# Patient Record
Sex: Male | Born: 1965
Health system: Southern US, Community
[De-identification: ages and names within clinical notes are randomized; demographics above are authoritative.]

## PROBLEM LIST (undated history)

## (undated) DIAGNOSIS — M419 Scoliosis, unspecified: Secondary | ICD-10-CM

## (undated) DIAGNOSIS — E119 Type 2 diabetes mellitus without complications: Secondary | ICD-10-CM

## (undated) DIAGNOSIS — J40 Bronchitis, not specified as acute or chronic: Secondary | ICD-10-CM

## (undated) DIAGNOSIS — T7840XA Allergy, unspecified, initial encounter: Secondary | ICD-10-CM

## (undated) DIAGNOSIS — S4990XA Unspecified injury of shoulder and upper arm, unspecified arm, initial encounter: Secondary | ICD-10-CM

## (undated) HISTORY — DX: Allergy, unspecified, initial encounter: T78.40XA

## (undated) HISTORY — DX: Scoliosis, unspecified: M41.9

## (undated) HISTORY — PX: WISDOM TOOTH EXTRACTION: SHX21

---

## 2014-10-20 ENCOUNTER — Emergency Department: Payer: Self-pay | Admitting: Emergency Medicine

## 2015-07-25 ENCOUNTER — Ambulatory Visit (INDEPENDENT_AMBULATORY_CARE_PROVIDER_SITE_OTHER): Payer: BLUE CROSS/BLUE SHIELD | Admitting: Family Medicine

## 2015-07-25 VITALS — BP 108/70 | HR 64 | Temp 98.3°F | Resp 16 | Ht 73.75 in | Wt 217.0 lb

## 2015-07-25 DIAGNOSIS — Z1211 Encounter for screening for malignant neoplasm of colon: Secondary | ICD-10-CM

## 2015-07-25 DIAGNOSIS — R7303 Prediabetes: Secondary | ICD-10-CM | POA: Diagnosis not present

## 2015-07-25 DIAGNOSIS — Z0000 Encounter for general adult medical examination without abnormal findings: Secondary | ICD-10-CM

## 2015-07-25 LAB — POCT CBC
GRANULOCYTE PERCENT: 48.3 % (ref 37–80)
HCT, POC: 43.4 % — AB (ref 43.5–53.7)
HEMOGLOBIN: 13.1 g/dL — AB (ref 14.1–18.1)
Lymph, poc: 1.8 (ref 0.6–3.4)
MCH: 29 pg (ref 27–31.2)
MCHC: 30.3 g/dL — AB (ref 31.8–35.4)
MCV: 95.8 fL (ref 80–97)
MID (CBC): 0.4 (ref 0–0.9)
MPV: 7.3 fL (ref 0–99.8)
POC GRANULOCYTE: 2.1 (ref 2–6.9)
POC LYMPH PERCENT: 42.7 %L (ref 10–50)
POC MID %: 9 % (ref 0–12)
Platelet Count, POC: 204 10*3/uL (ref 142–424)
RBC: 4.53 M/uL — AB (ref 4.69–6.13)
RDW, POC: 13.4 %
WBC: 4.3 10*3/uL — AB (ref 4.6–10.2)

## 2015-07-25 LAB — COMPREHENSIVE METABOLIC PANEL
ALK PHOS: 49 U/L (ref 40–115)
ALT: 69 U/L — AB (ref 9–46)
AST: 55 U/L — AB (ref 10–35)
Albumin: 4.3 g/dL (ref 3.6–5.1)
BILIRUBIN TOTAL: 0.5 mg/dL (ref 0.2–1.2)
BUN: 17 mg/dL (ref 7–25)
CO2: 24 mmol/L (ref 20–31)
CREATININE: 1.01 mg/dL (ref 0.70–1.33)
Calcium: 9.5 mg/dL (ref 8.6–10.3)
Chloride: 103 mmol/L (ref 98–110)
GLUCOSE: 106 mg/dL — AB (ref 65–99)
Potassium: 4.1 mmol/L (ref 3.5–5.3)
SODIUM: 138 mmol/L (ref 135–146)
TOTAL PROTEIN: 7.9 g/dL (ref 6.1–8.1)

## 2015-07-25 LAB — POCT GLYCOSYLATED HEMOGLOBIN (HGB A1C): Hemoglobin A1C: 6.2

## 2015-07-25 LAB — LIPID PANEL
Cholesterol: 222 mg/dL — ABNORMAL HIGH (ref 125–200)
HDL: 50 mg/dL (ref >=40)
LDL CALC: 141 mg/dL — AB (ref <130)
TRIGLYCERIDES: 154 mg/dL — AB (ref <150)
Total CHOL/HDL Ratio: 4.4 Ratio (ref <=5.0)
VLDL: 31 mg/dL — AB (ref <30)

## 2015-07-25 LAB — HEMOGLOBIN A1C: HEMOGLOBIN A1C: 6.2 % — AB (ref 4.0–6.0)

## 2015-07-25 NOTE — Progress Notes (Signed)
Physical examination:  History: 49 year old man who comes in for a exam because he is getting ready to get married on Saturday. He has generally been doing well with no major problems or concerns. He just wanted to make sure everything was fine before he got married.  There is a family history of diabetes and he wants to make sure that is checked out.  Past medical history: Hospitalizations: None Operations: None Major illnesses: None Regular medications: None Medication allergies none Seasonal allergies to pollen  Family history: Father is deceased, had diabetes. Mother still living and has diabetes. The patient has 3 children all living and well.  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 He gets a lot of exercise with his work. He does not smoke, drink, or use drugs. He be having a church wedding this weekend. One of his children is getting ready to go into the [employer], the [employer].  Review of systems: Constitutional: Unremarkable HEENT unremarkable. He does probably need some dental work done some time. 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 Respiratory: Unremarkable Cardiovascular: Unremarkable GI: Unremarkable GU: Unremarkable Musculoskeletal: Unremarkable Dermatologic: Unremarkable Neurologic: Unremarkable Psychiatric: Unremarkable  Physical exam: Healthy-appearing gentleman in no acute distress. TMs normal. Throat clear. 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 Has a couple of little cavity type lesions on at least one of his molars. His neck is supple without nodes or thyromegaly. Chest clear to auscultation. Heart regular without murmurs. No bruits. Abdomen soft without mass or tenderness. Normal male external genitalia with testes descended. No hernias. Extremities unremarkable. Skin unremarkable.  Normal physical examination  Plan: Check some screening labs on him.  Recommend a screening colonoscopy in the not too distant future  Results for orders placed or performed in visit on 07/25/15  POCT CBC  Result Value Ref Range   WBC 4.3 (A) 4.6 - 10.2 K/uL   Lymph, poc 1.8 0.6 - 3.4   POC LYMPH PERCENT 42.7 10 - 50 %L   MID (cbc) 0.4 0 - 0.9   POC MID % 9.0 0 - 12 %M   POC Granulocyte 2.1 2 - 6.9   Granulocyte percent 48.3 37 - 80 %G   RBC 4.53 (A) 4.69 - 6.13 M/uL   Hemoglobin 13.1 (A) 14.1 - 18.1 g/dL   HCT, POC 60.4 (A) 54.0 - 53.7 %   MCV 95.8 80 - 97 fL   MCH, POC 29.0 27 - 31.2 pg   MCHC 30.3 (A) 31.8 - 35.4 g/dL   RDW, POC 98.1 %   Platelet Count, POC 204 142 - 424 K/uL   MPV 7.3 0 - 99.8 fL  POCT glycosylated hemoglobin (Hb A1C)  Result Value Ref Range   Hemoglobin A1C 6.2

## 2015-07-25 NOTE — Patient Instructions (Signed)
Try to get some regular cardio type exercise (aerobic) such as walking or running or swimming or biking or other exercise machinery on a regular basis about 30 minutes a day 5 days a week.  Try to decrease the intake of excessive carbohydrates such as breads, pastas, potatoes, rice. Brown red/green products are better for you.  Set a goal of losing about 15 pounds to get down below 200  Return in 6 months for a recheck, which would be about early April  Return sooner if problems  I recommend you consider getting a flu shot

## 2015-07-26 LAB — GC/CHLAMYDIA PROBE AMP
CT PROBE, AMP APTIMA: NEGATIVE
GC PROBE AMP APTIMA: NEGATIVE

## 2015-07-26 LAB — HIV ANTIBODY (ROUTINE TESTING W REFLEX): HIV 1&2 Ab, 4th Generation: NONREACTIVE

## 2015-07-26 LAB — PSA: PSA: 1.26 ng/mL (ref <=4.00)

## 2015-07-26 LAB — RPR

## 2015-08-22 ENCOUNTER — Encounter: Payer: Self-pay | Admitting: Family Medicine

## 2016-02-06 ENCOUNTER — Ambulatory Visit: Payer: Self-pay | Admitting: Urology

## 2016-05-07 ENCOUNTER — Other Ambulatory Visit: Payer: Self-pay | Admitting: Family Medicine

## 2016-05-07 ENCOUNTER — Ambulatory Visit (INDEPENDENT_AMBULATORY_CARE_PROVIDER_SITE_OTHER): Payer: BLUE CROSS/BLUE SHIELD | Admitting: Family Medicine

## 2016-05-07 VITALS — BP 122/76 | HR 79 | Temp 98.3°F | Resp 17 | Ht 74.0 in | Wt 221.0 lb

## 2016-05-07 DIAGNOSIS — E785 Hyperlipidemia, unspecified: Secondary | ICD-10-CM | POA: Diagnosis not present

## 2016-05-07 DIAGNOSIS — N529 Male erectile dysfunction, unspecified: Secondary | ICD-10-CM

## 2016-05-07 DIAGNOSIS — R748 Abnormal levels of other serum enzymes: Secondary | ICD-10-CM | POA: Diagnosis not present

## 2016-05-07 LAB — COMPREHENSIVE METABOLIC PANEL
ALBUMIN: 4.5 g/dL (ref 3.6–5.1)
ALK PHOS: 51 U/L (ref 40–115)
ALT: 64 U/L — AB (ref 9–46)
AST: 46 U/L — ABNORMAL HIGH (ref 10–35)
BUN: 13 mg/dL (ref 7–25)
CHLORIDE: 103 mmol/L (ref 98–110)
CO2: 27 mmol/L (ref 20–31)
CREATININE: 1.19 mg/dL (ref 0.70–1.33)
Calcium: 9.3 mg/dL (ref 8.6–10.3)
Glucose, Bld: 122 mg/dL — ABNORMAL HIGH (ref 65–99)
POTASSIUM: 4.3 mmol/L (ref 3.5–5.3)
SODIUM: 138 mmol/L (ref 135–146)
TOTAL PROTEIN: 8 g/dL (ref 6.1–8.1)
Total Bilirubin: 0.6 mg/dL (ref 0.2–1.2)

## 2016-05-07 LAB — LIPASE: LIPASE: 17 U/L (ref 7–60)

## 2016-05-07 MED ORDER — SILDENAFIL CITRATE 20 MG PO TABS: ORAL_TABLET | ORAL | Status: DC

## 2016-05-07 NOTE — Patient Instructions (Signed)
Take the sildenafil  20 mg 2-4 pills approximately one to 2 hours prior to anticipated intercourse.  Get regular exercise  Work on weight loss  We will let you know the results of your cholesterol level and your liver enzymes.  Return as needed

## 2016-05-07 NOTE — Progress Notes (Addendum)
Patient ID: Carl Barnett, male    DOB: December 12, 1965  Age: 50 y.o. MRN: [id number]  Chief Complaint  Patient presents with  . Medication Problem    Subjective:   50 year old man previously seen by me who is here with a problem with erectile dysfunction that is been going on for 2 months. He took 3 "testosterone pills" then I friend of his gave him. They made him very high and jittery and nervous. I suspect they were some other substance than testosterone. At any rate he has not taken anymore contaminated not help the erectile dysfunction.  He never did come back in for labs as instructed last year. He is not certain he ever got the message. He is a Naval architecttruck driver.  Current allergies, medications, problem list, past/family and social histories reviewed.  Objective:  BP 122/76 mmHg  Pulse 79  Temp(Src) 98.3 F (36.8 C) (Oral)  Resp 17  Ht 6\' 2"  (1.88 m)  Wt 221 lb (100.245 kg)  BMI 28.36 kg/m2  SpO2 99%  No acute distress. Long discussion. Did not reexamine him.  Assessment & Plan:   Assessment: 1. Erectile dysfunction, unspecified erectile dysfunction type   2. Hyperlipidemia   3. Elevated liver enzymes       Plan: See instructions. Will refer to a urologist if he keeps having ADD problems.  Orders Placed This Encounter  Procedures  . Comprehensive metabolic panel  . Lipase    Meds ordered this encounter  Medications  . sildenafil (REVATIO) 20 MG tablet    Sig: Take 2-4 pills one hour prior to anticipated intercourse    Dispense:  30 tablet    Refill:  2         Patient Instructions  Take the sildenafil  20 mg 2-4 pills approximately one to 2 hours prior to anticipated intercourse.  Get regular exercise  Work on weight loss  We will let you know the results of your cholesterol level and your liver enzymes.  Return as needed     Return if symptoms worsen or fail to improve. I accidentally ordered lipase instead of lipids. That will be ordered. The  liver enzymes are still a little elevated so will check hep B and hep C.  Maley Venezia, MD 05/07/2016

## 2016-05-07 NOTE — Addendum Note (Signed)
Addended by: Inaki Vantine H on: 05/07/2016 06:19 PM   Modules accepted: Orders

## 2016-05-08 LAB — LIPID PANEL
CHOLESTEROL: 201 mg/dL — AB (ref 125–200)
HDL: 55 mg/dL (ref >=40)
LDL Cholesterol: 122 mg/dL (ref <130)
Total CHOL/HDL Ratio: 3.7 Ratio (ref <=5.0)
Triglycerides: 122 mg/dL (ref <150)
VLDL: 24 mg/dL (ref <30)

## 2016-05-08 LAB — HEPATITIS C ANTIBODY: HCV AB: NEGATIVE

## 2016-05-08 LAB — HEPATITIS B SURFACE ANTIBODY, QUANTITATIVE: HEPATITIS B-POST: 0 m[IU]/mL

## 2018-01-01 DIAGNOSIS — M47812 Spondylosis without myelopathy or radiculopathy, cervical region: Secondary | ICD-10-CM | POA: Insufficient documentation

## 2018-03-12 DIAGNOSIS — G5622 Lesion of ulnar nerve, left upper limb: Secondary | ICD-10-CM | POA: Insufficient documentation

## 2019-04-23 ENCOUNTER — Ambulatory Visit: Payer: Self-pay | Admitting: *Deleted

## 2019-04-23 ENCOUNTER — Other Ambulatory Visit: Payer: Self-pay

## 2019-04-23 DIAGNOSIS — Z20822 Contact with and (suspected) exposure to covid-19: Secondary | ICD-10-CM

## 2019-04-23 NOTE — Telephone Encounter (Signed)
  Patient was tested in April for [place]- diagnosed with bronchitis. Patient was around someone at work who was very ill. Patient does not have PCP in area. Patient has been set up for testing and he is going to follow up at [facility]/virtual visit for cough and other symptoms- in case bronchitis. Patient plans to establish care with local PCP as soon as he gets insurance.  Reason for Disposition . [1] Continuous (nonstop) coughing interferes with work or school AND [2] no improvement using cough treatment per protocol  Answer Assessment - Initial Assessment Questions 1. COVID-19 DIAGNOSIS: "Who made your Coronavirus (COVID-19) diagnosis?" "Was it confirmed by a positive lab test?" If not diagnosed by a HCP, ask "Are there lots of cases (community spread) where you live?" (See public health department website, if unsure)     Patient is having symptoms 2. ONSET: "When did the COVID-19 symptoms start?"      Tuesday 3. WORST SYMPTOM: "What is your worst symptom?" (e.g., cough, fever, shortness of breath, muscle aches)     Cough  4. COUGH: "Do you have a cough?" If so, ask: "How bad is the cough?"       Yes- heavy phlegm  5. FEVER: "Do you have a fever?" If so, ask: "What is your temperature, how was it measured, and when did it start?"     No- yesterday was sweating 6. RESPIRATORY STATUS: "Describe your breathing?" (e.g., shortness of breath, wheezing, unable to speak)      Patient has inhaler- has been using- is some better 7. BETTER-SAME-WORSE: "Are you getting better, staying the same or getting worse compared to yesterday?"  If getting worse, ask, "In what way?"     Same 8. HIGH RISK DISEASE: "Do you have any chronic medical problems?" (e.g., asthma, heart or lung disease, weak immune system, etc.)     no 9. PREGNANCY: "Is there any chance you are pregnant?" "When was your last menstrual period?"     n/a 10. OTHER SYMPTOMS: "Do you have any other symptoms?"  (e.g., chills, fatigue,  headache, loss of smell or taste, muscle pain, sore throat)       Fatigue, headache  Protocols used: CORONAVIRUS (COVID-19) DIAGNOSED OR SUSPECTED-A-AH

## 2019-04-29 ENCOUNTER — Telehealth: Payer: Self-pay | Admitting: *Deleted

## 2019-04-29 LAB — NOVEL CORONAVIRUS, NAA: SARS-CoV-2, NAA: DETECTED — AB

## 2019-04-29 NOTE — Telephone Encounter (Signed)
Left message on VM for pt to CB regarding covid results.  CB# [phone]

## 2019-05-03 ENCOUNTER — Emergency Department (HOSPITAL_COMMUNITY): Payer: HRSA Program

## 2019-05-03 ENCOUNTER — Other Ambulatory Visit: Payer: Self-pay

## 2019-05-03 ENCOUNTER — Observation Stay (HOSPITAL_COMMUNITY)
Admission: EM | Admit: 2019-05-03 | Discharge: 2019-05-04 | Disposition: A | Discharge status: Home / Self Care | Payer: HRSA Program | Attending: Student | Admitting: Student

## 2019-05-03 ENCOUNTER — Encounter (HOSPITAL_COMMUNITY): Payer: Self-pay

## 2019-05-03 DIAGNOSIS — R042 Hemoptysis: Secondary | ICD-10-CM | POA: Diagnosis present

## 2019-05-03 DIAGNOSIS — Z8249 Family history of ischemic heart disease and other diseases of the circulatory system: Secondary | ICD-10-CM | POA: Diagnosis not present

## 2019-05-03 DIAGNOSIS — U071 COVID-19: Principal | ICD-10-CM | POA: Diagnosis present

## 2019-05-03 DIAGNOSIS — R030 Elevated blood-pressure reading, without diagnosis of hypertension: Secondary | ICD-10-CM | POA: Diagnosis not present

## 2019-05-03 DIAGNOSIS — R05 Cough: Secondary | ICD-10-CM | POA: Diagnosis present

## 2019-05-03 DIAGNOSIS — R059 Cough, unspecified: Secondary | ICD-10-CM

## 2019-05-03 DIAGNOSIS — Z79899 Other long term (current) drug therapy: Secondary | ICD-10-CM | POA: Insufficient documentation

## 2019-05-03 LAB — CBC WITH DIFFERENTIAL/PLATELET
Abs Immature Granulocytes: 0.01 10*3/uL (ref 0.00–0.07)
Basophils Absolute: 0 10*3/uL (ref 0.0–0.1)
Basophils Relative: 0 %
Eosinophils Absolute: 0.1 10*3/uL (ref 0.0–0.5)
Eosinophils Relative: 2 %
HCT: 39 % (ref 39.0–52.0)
Hemoglobin: 12.4 g/dL — ABNORMAL LOW (ref 13.0–17.0)
Immature Granulocytes: 0 %
Lymphocytes Relative: 49 %
Lymphs Abs: 1.9 10*3/uL (ref 0.7–4.0)
MCH: 31.9 pg (ref 26.0–34.0)
MCHC: 31.8 g/dL (ref 30.0–36.0)
MCV: 100.3 fL — ABNORMAL HIGH (ref 80.0–100.0)
Monocytes Absolute: 0.3 10*3/uL (ref 0.1–1.0)
Monocytes Relative: 8 %
Neutro Abs: 1.6 10*3/uL — ABNORMAL LOW (ref 1.7–7.7)
Neutrophils Relative %: 41 %
Platelets: 152 10*3/uL (ref 150–400)
RBC: 3.89 MIL/uL — ABNORMAL LOW (ref 4.22–5.81)
RDW: 11.8 % (ref 11.5–15.5)
WBC: 3.8 10*3/uL — ABNORMAL LOW (ref 4.0–10.5)
nRBC: 0 % (ref 0.0–0.2)

## 2019-05-03 LAB — COMPREHENSIVE METABOLIC PANEL
ALT: 92 U/L — ABNORMAL HIGH (ref 0–44)
AST: 59 U/L — ABNORMAL HIGH (ref 15–41)
Albumin: 3.4 g/dL — ABNORMAL LOW (ref 3.5–5.0)
Alkaline Phosphatase: 59 U/L (ref 38–126)
Anion gap: 10 (ref 5–15)
BUN: 10 mg/dL (ref 6–20)
CO2: 24 mmol/L (ref 22–32)
Calcium: 8.9 mg/dL (ref 8.9–10.3)
Chloride: 104 mmol/L (ref 98–111)
Creatinine, Ser: 0.87 mg/dL (ref 0.61–1.24)
GFR calc Af Amer: >60 mL/min (ref >60)
GFR calc non Af Amer: >60 mL/min (ref >60)
Glucose, Bld: 91 mg/dL (ref 70–99)
Potassium: 3.6 mmol/L (ref 3.5–5.1)
Sodium: 138 mmol/L (ref 135–145)
Total Bilirubin: 0.8 mg/dL (ref 0.3–1.2)
Total Protein: 7.1 g/dL (ref 6.5–8.1)

## 2019-05-03 LAB — FERRITIN: Ferritin: 795 ng/mL — ABNORMAL HIGH (ref 24–336)

## 2019-05-03 LAB — ABO/RH: ABO/RH(D): A POS

## 2019-05-03 LAB — TRIGLYCERIDES: Triglycerides: 131 mg/dL (ref <150)

## 2019-05-03 LAB — LACTIC ACID, PLASMA: Lactic Acid, Venous: 0.9 mmol/L (ref 0.5–1.9)

## 2019-05-03 LAB — LACTATE DEHYDROGENASE: LDH: 218 U/L — ABNORMAL HIGH (ref 98–192)

## 2019-05-03 LAB — C-REACTIVE PROTEIN: CRP: 3.6 mg/dL — ABNORMAL HIGH (ref <1.0)

## 2019-05-03 LAB — PROCALCITONIN: Procalcitonin: 0.12 ng/mL

## 2019-05-03 LAB — SARS CORONAVIRUS 2 BY RT PCR (HOSPITAL ORDER, PERFORMED IN ~~LOC~~ HOSPITAL LAB): SARS Coronavirus 2: POSITIVE — AB

## 2019-05-03 MED ORDER — HYDRALAZINE HCL 20 MG/ML IJ SOLN: 5.0000 mg | Freq: Four times a day (QID) | INTRAMUSCULAR | Status: DC | PRN

## 2019-05-03 MED ORDER — ONDANSETRON HCL 4 MG PO TABS: 4.0000 mg | ORAL_TABLET | Freq: Four times a day (QID) | ORAL | Status: DC | PRN

## 2019-05-03 MED ORDER — ZINC SULFATE 220 (50 ZN) MG PO CAPS
220.0000 mg | ORAL_CAPSULE | Freq: Every day | ORAL | Status: DC
  Administered 2019-05-03 – 2019-05-04 (×2): 220 mg via ORAL
  Filled 2019-05-03 (×2): qty 1

## 2019-05-03 MED ORDER — ALBUTEROL SULFATE HFA 108 (90 BASE) MCG/ACT IN AERS
2.0000 | INHALATION_SPRAY | Freq: Four times a day (QID) | RESPIRATORY_TRACT | Status: DC
  Administered 2019-05-03 – 2019-05-04 (×2): 2 via RESPIRATORY_TRACT
  Filled 2019-05-03 (×2): qty 6.7

## 2019-05-03 MED ORDER — ONDANSETRON HCL 4 MG/2ML IJ SOLN: 4.0000 mg | Freq: Four times a day (QID) | INTRAMUSCULAR | Status: DC | PRN

## 2019-05-03 MED ORDER — GUAIFENESIN-DM 100-10 MG/5ML PO SYRP
10.0000 mL | ORAL_SOLUTION | ORAL | Status: DC | PRN
  Administered 2019-05-04 (×2): 10 mL via ORAL
  Filled 2019-05-03 (×2): qty 10

## 2019-05-03 MED ORDER — ENOXAPARIN SODIUM 40 MG/0.4ML ~~LOC~~ SOLN
40.0000 mg | SUBCUTANEOUS | Status: DC
  Administered 2019-05-03: 40 mg via SUBCUTANEOUS
  Filled 2019-05-03: qty 0.4

## 2019-05-03 MED ORDER — VITAMIN C 500 MG PO TABS
500.0000 mg | ORAL_TABLET | Freq: Every day | ORAL | Status: DC
  Administered 2019-05-03 – 2019-05-04 (×2): 500 mg via ORAL
  Filled 2019-05-03 (×2): qty 1

## 2019-05-03 MED ORDER — METHYLPREDNISOLONE SODIUM SUCC 40 MG IJ SOLR
40.0000 mg | Freq: Once | INTRAMUSCULAR | Status: AC
  Administered 2019-05-03: 40 mg via INTRAVENOUS
  Filled 2019-05-03: qty 1

## 2019-05-03 MED ORDER — HYDROCOD POLST-CPM POLST ER 10-8 MG/5ML PO SUER: 5.0000 mL | Freq: Two times a day (BID) | ORAL | Status: DC | PRN

## 2019-05-03 MED ORDER — ACETAMINOPHEN 325 MG PO TABS
650.0000 mg | ORAL_TABLET | Freq: Four times a day (QID) | ORAL | Status: DC | PRN
  Administered 2019-05-04: 650 mg via ORAL
  Filled 2019-05-03: qty 2

## 2019-05-03 NOTE — ED Provider Notes (Addendum)
[facility] Provider Note   CSN: [id number] Arrival date & time: 05/03/19  1511     History   Chief Complaint No chief complaint on file.   HPI Carl Barnett is a 53 y.o. male.     53 year old male with medical history as detailed below presents for evaluation of cough and shortness of breath.  Patient reports increasing cough over the last 1 to 2 weeks.  Patient reports that he was tested for COVID last Tuesday -however, he is unaware of the results.  He reports subjective fevers at home.  He reports a productive cough with some blood tinged sputum.  He denies associated chest pain.  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  He has no routine primary medical care.  The history is provided by the patient and medical records.  Cough Cough characteristics:  Productive Sputum characteristics:  Bloody Severity:  Mild Onset quality:  Gradual Duration:  2 weeks Timing:  Constant Progression:  Worsening Chronicity:  New Relieved by:  Nothing Worsened by:  Nothing Associated symptoms: fever     Past Medical History:  Diagnosis Date   Allergy     There are no active problems to display for this patient.   No past surgical history on file.      Home Medications    Prior to Admission medications   Medication Sig Start Date End Date Taking? Authorizing Provider  Acetaminophen-guaiFENesin [facility](THERAFLU FLU/CHEST CONGESTION PO) Take 1 packet by mouth every 6 (six) hours as needed (for chest congestion/malaise).   Yes [provider]  Albuterol Sulfate 108 (90 Base) MCG/ACT AEPB Inhale 2 puffs into the lungs every 4 (four) hours as needed (for shortness of breath or wheezing).   Yes [provider]  guaiFENesin (MUCINEX) 600 MG 12 hr tablet Take 600 mg by mouth 2 (two) times daily as needed for to loosen phlegm.   Yes [provider]  sildenafil (REVATIO) 20 MG tablet Take 2-4 pills  one hour prior to anticipated intercourse Patient not taking: Reported on 05/03/2019 05/07/16   Peyton NajjarHopper, David H, MD    Family History Family History  Problem Relation Age of Onset   Diabetes Mother    Stroke Mother    Diabetes Father    Hyperlipidemia Father    Hypertension Father     Social History Social History   Tobacco Use   Smoking status: Never Smoker  Substance Use Topics   Alcohol use: No    Alcohol/week: 0.0 standard drinks   Drug use: No     Allergies   Patient has no known allergies.   Review of Systems Review of Systems  Constitutional: Positive for fever.  Respiratory: Positive for cough.   All other systems reviewed and are negative.    Physical Exam Updated Vital Signs BP (!) 170/98 (BP Location: Left Arm)    Pulse (!) 58    Temp 99.1 F (37.3 C) (Oral)    Resp 15    SpO2 99%   Physical Exam Vitals signs and nursing note reviewed.  Constitutional:      General: He is not in acute distress.    Appearance: Normal appearance. He is well-developed.  HENT:     Head: Normocephalic and atraumatic.  Eyes:     Conjunctiva/sclera: Conjunctivae normal.     Pupils: Pupils are equal, round, and reactive to light.  Neck:     Musculoskeletal: Normal range of motion and  neck supple.  Cardiovascular:     Rate and Rhythm: Normal rate and regular rhythm.     Heart sounds: Normal heart sounds.  Pulmonary:     Effort: Pulmonary effort is normal. No respiratory distress.     Breath sounds: Normal breath sounds.  Abdominal:     General: There is no distension.     Palpations: Abdomen is soft.     Tenderness: There is no abdominal tenderness.  Musculoskeletal: Normal range of motion.        General: No deformity.  Skin:    General: Skin is warm and dry.  Neurological:     Mental Status: He is alert and oriented to person, place, and time.      ED Treatments / Results  Labs (all labs ordered are listed, but only abnormal results are  displayed) Labs Reviewed  SARS CORONAVIRUS 2 ([facility]) - Abnormal; Notable for the following components:      Result Value   SARS Coronavirus 2 POSITIVE (*)    All other components within normal limits  CBC WITH DIFFERENTIAL/PLATELET - Abnormal; Notable for the following components:   WBC 3.8 (*)    RBC 3.89 (*)    Hemoglobin 12.4 (*)    MCV 100.3 (*)    Neutro Abs 1.6 (*)    All other components within normal limits  COMPREHENSIVE METABOLIC PANEL - Abnormal; Notable for the following components:   Albumin 3.4 (*)    AST 59 (*)    ALT 92 (*)    All other components within normal limits  LACTATE DEHYDROGENASE - Abnormal; Notable for the following components:   LDH 218 (*)    All other components within normal limits  FERRITIN - Abnormal; Notable for the following components:   Ferritin 795 (*)    All other components within normal limits  C-REACTIVE PROTEIN - Abnormal; Notable for the following components:   CRP 3.6 (*)    All other components within normal limits  CULTURE, BLOOD (ROUTINE X 2)  CULTURE, BLOOD (ROUTINE X 2)  LACTIC ACID, PLASMA  PROCALCITONIN  TRIGLYCERIDES  LACTIC ACID, PLASMA    EKG EKG Interpretation  Date/Time:  Sunday May 03 2019 15:41:38 EDT Ventricular Rate:  56 PR Interval:    QRS Duration: 82 QT Interval:  448 QTC Calculation: 433 R Axis:   39 Text Interpretation:  Sinus rhythm Atrial premature complex Confirmed by Dene Gentry [phone]) on 05/03/2019 4:08:15 PM   Radiology Dg Chest Port 1 View  Result Date: 05/03/2019 CLINICAL DATA:  Positive COVID-19 test with fatigue and productive cough, initial encounter EXAM: PORTABLE CHEST 1 VIEW COMPARISON:  10/20/2014 FINDINGS: Cardiac shadow is stable. The lungs are well aerated bilaterally. No focal infiltrate or sizable effusion is seen. No bony abnormality is noted. IMPRESSION: No acute abnormality noted. Electronically Signed   By: Inez Catalina M.D.    On: 05/03/2019 16:54    Procedures Procedures (including critical care time)  Medications Ordered in ED Medications - No data to display   Initial Impression / Assessment and Plan / ED Course  I have reviewed the triage vital signs and the nursing notes.  Pertinent labs & imaging results that were available during my care of the patient were reviewed by me and considered in my medical decision making (see chart for details).        MDM  Screen complete  Kanoa Phillippi was evaluated in Emergency Department on 05/03/2019 for the  symptoms described in the history of present illness. He was evaluated in the context of the global COVID-19 pandemic, which necessitated consideration that the patient might be at risk for infection with the SARS-CoV-2 virus that causes COVID-19. Institutional protocols and algorithms that pertain to the evaluation of patients at risk for COVID-19 are in a state of rapid change based on information released by regulatory bodies including the CDC and federal and state organizations. These policies and algorithms were followed during the patient's care in the ED.   Patient is presenting with reported cough, hemoptysis, and fevers.  Patient is COVID positive.  Patient would likely benefit from further inpatient work-up and treatment.  Medicine service is aware of case and will evaluate for admission.  Final Clinical Impressions(s) / ED Diagnoses   Final diagnoses:  COVID-19 virus detected  Cough  Hemoptysis    ED Discharge Orders    None       Wynetta FinesMessick, Jarquis Walker C, MD 05/03/19 Izola [id number]    Wynetta FinesMessick, Mattie Nordell C, MD 05/03/19 [phone]

## 2019-05-03 NOTE — H&P (Signed)
History and Physical    Carl Barnett FAO:[id number] DOB: [birth year] DOA: 05/03/2019  PCP: Patient, No Pcp Per  Patient coming from: Home  I have personally briefly reviewed patient's old medical records in [place]  Chief Complaint: Cough productive of blood-tinged sputum  HPI: Carl Barnett is a 53 y.o. male with medical history significant for cervical radiculopathy who presents to the ED for evaluation of progressive cough productive of blood-tinged sputum.  Patient states he had bronchitis in early April and May at which time he was tested for COVID-19 twice, both times tested negative.  About 2 weeks ago he had return of similar symptoms with cough productive of blood-tinged sputum.  He had associated musculoskeletal sternal chest pain with coughing.  He has had myalgias the last 2 days.  He otherwise has not had any subjective fevers, diaphoresis, dyspnea, abdominal pain, or diarrhea.  He was tested again for COVID-19 on 04/23/2019 which did result positive on 04/29/2019, however he was unable to be contacted.  He says since his symptoms have been persistent with new myalgias states last 2 days he presented to the ED for further evaluation.  He has been trying supportive care with Mucinex, TheraFlu, Flonase, and an inhaler without significant relief.  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  ED Course:  Initial vitals showed BP 170/98, pulse 58, RR 15, temp 99.1 Fahrenheit, SPO2 99% on room air.  Labs are notable for WBC 3.8, hemoglobin 12.4, platelets 152,000, BUN 10, creatinine 0.87, AST 59, ALT 92, alk phos 59, total bilirubin 0.8.  SARS-CoV-2 test is positive.  Procalcitonin 0.12, LDH 218, ferritin 795, CRP 3.6, triglycerides 131.  Lactic acid 0.9.  Blood cultures were drawn and pending.  Review of Systems: All systems reviewed and are negative except as documented in history of  present illness above.   Past Medical History:  Diagnosis Date  . Allergy     No past surgical history on file.  Social History:  reports that he has never smoked. He does not have any smokeless tobacco history on file. He reports that he does not drink alcohol or use drugs.  No Known Allergies  Family History  Problem Relation Age of Onset  . Diabetes Mother   . Stroke Mother   . Diabetes Father   . Hyperlipidemia Father   . Hypertension Father      Prior to Admission medications   Medication Sig Start Date End Date Taking? Authorizing Provider  Acetaminophen-guaiFENesin [facility] FLU/CHEST CONGESTION PO) Take 1 packet by mouth every 6 (six) hours as needed (for chest congestion/malaise).   Yes [provider]  Albuterol Sulfate 108 (90 Base) MCG/ACT AEPB Inhale 2 puffs into the lungs every 4 (four) hours as needed (for shortness of breath or wheezing).   Yes [provider]  guaiFENesin (MUCINEX) 600 MG 12 hr tablet Take 600 mg by mouth 2 (two) times daily as needed for to loosen phlegm.   Yes [provider]  sildenafil (REVATIO) 20 MG tablet Take 2-4 pills one hour prior to anticipated intercourse Patient not taking: Reported on 05/03/2019 05/07/16   Posey Boyer, MD    Physical Exam: Vitals:   05/03/19 1615 05/03/19 1630 05/03/19 1645 05/03/19 1715  BP: (!) 153/96 (!) 168/100 (!) 162/98 (!) 170/105  Pulse:  (!) 57 (!) 55 (!) 59  Resp: 17 (!) 22 (!) 25 17  Temp:      TempSrc:      SpO2:  96% 97% 98%    Constitutional: Sitting up in bed, NAD, calm, comfortable Eyes: PERRL, lids and conjunctivae normal ENMT: Mucous membranes are moist. Posterior pharynx clear of any exudate or lesions.Normal dentition.  Neck: normal, supple, no masses. Respiratory: Bibasilar inspiratory crackles. Normal respiratory effort. No accessory muscle use.  Cardiovascular: Regular rate and rhythm, no murmurs / rubs / gallops. No extremity edema. 2+ pedal pulses.  Abdomen: no tenderness, no masses palpated. No hepatosplenomegaly. Bowel sounds positive.  Musculoskeletal: no clubbing / cyanosis. No joint deformity upper and lower extremities. Good ROM, no contractures. Normal muscle tone.  Skin: no rashes, lesions, ulcers. No induration Neurologic: CN 2-12 grossly intact. Sensation intact, Strength 5/5 in all 4.  Psychiatric: Normal judgment and insight. Alert and oriented x 3. Normal mood.     Labs on Admission: I have personally reviewed following labs and imaging studies  CBC: Recent Labs  Lab 05/03/19 1620  WBC 3.8*  NEUTROABS 1.6*  HGB 12.4*  HCT 39.0  MCV 100.3*  PLT 160   Basic Metabolic Panel: Recent Labs  Lab 05/03/19 1620  NA 138  K 3.6  CL 104  CO2 24  GLUCOSE 91  BUN 10  CREATININE 0.87  CALCIUM 8.9   GFR: CrCl cannot be calculated (Unknown ideal weight.). Liver Function Tests: Recent Labs  Lab 05/03/19 1620  AST 59*  ALT 92*  ALKPHOS 59  BILITOT 0.8  PROT 7.1  ALBUMIN 3.4*   No results for input(s): LIPASE, AMYLASE in the last 168 hours. No results for input(s): AMMONIA in the last 168 hours. Coagulation Profile: No results for input(s): INR, PROTIME in the last 168 hours. Cardiac Enzymes: No results for input(s): CKTOTAL, CKMB, CKMBINDEX, TROPONINI in the last 168 hours. BNP (last 3 results) No results for input(s): PROBNP in the last 8760 hours. HbA1C: No results for input(s): HGBA1C in the last 72 hours. CBG: No results for input(s): GLUCAP in the last 168 hours. Lipid Profile: Recent Labs    05/03/19 1620  TRIG 131   Thyroid Function Tests: No results for input(s): TSH, T4TOTAL, FREET4, T3FREE, THYROIDAB in the last 72 hours. Anemia Panel: Recent Labs    05/03/19 1620  FERRITIN 795*   Urine analysis: No results found for: COLORURINE, APPEARANCEUR, LABSPEC, PHURINE, GLUCOSEU, HGBUR, BILIRUBINUR, KETONESUR, PROTEINUR, UROBILINOGEN, NITRITE, LEUKOCYTESUR  Radiological Exams on Admission:  Dg Chest Port 1 View  Result Date: 05/03/2019 CLINICAL DATA:  Positive COVID-19 test with fatigue and productive cough, initial encounter EXAM: PORTABLE CHEST 1 VIEW COMPARISON:  10/20/2014 FINDINGS: Cardiac shadow is stable. The lungs are well aerated bilaterally. No focal infiltrate or sizable effusion is seen. No bony abnormality is noted. IMPRESSION: No acute abnormality noted. Electronically Signed   By: Inez Catalina M.D.   On: 05/03/2019 16:54    EKG: Independently reviewed.  Sinus rhythm without acute ischemic changes.  No prior for comparison.  Assessment/Plan Active Problems:   COVID-19 virus infection  Suliman Termini is a 53 y.o. male with medical history significant for cervical radiculopathy who is admitted with upper respiratory infection due to COVID-19.   COVID-19 viral infection: With associated URI/bronchitis.  Currently saturating well on room air.  Chest x-ray is reassuring.  He does have inspiratory crackles on admission examination.  Inflammatory markers show CRP 3.6, ferritin 795, LDH 218.  Procalcitonin 0.12.  Will observe overnight and get 1 dose of IV Solu-Medrol, can likely transition to  oral prednisone and discharge tomorrow. -IV Solu-Medrol 40 mg once now -Supplemental oxygen as needed -Continue albuterol inhaler, senna spirometer, flutter valve, antitussives, vitamin C, and zinc  Elevated blood pressure: Hypertensive on admission, no prior history of hypertension.  Will use IV hydralazine as needed, consider addition of antihypertensive medication on discharge if remains persistently elevated.  DVT prophylaxis: Lovenox Code Status: Full code, confirmed with patient Family Communication: Discussed with patient, he will communicate to family Disposition Plan: Likely discharge to home in 1 day Consults called: None Admission status: Observation   Zada Finders MD [facility]  If 7PM-7AM, please contact night-coverage [email or web address]  05/03/2019, 8:27  PM

## 2019-05-03 NOTE — ED Notes (Signed)
ED TO INPATIENT HANDOFF REPORT  ED Nurse Name and Phone #: Susa RaringLisa, RN [phone]  S Name/Age/Gender Carl Barnett 53 y.o. male Room/Bed: 040C/040C  Code Status   Code Status: Not on file  Home/SNF/Other Home Patient oriented to: self, place, time and situation Is this baseline? Yes   Triage Complete: Triage complete  Chief Complaint cough; Hematemesis  Triage Note No notes on file   Allergies No Known Allergies  Level of Care/Admitting Diagnosis ED Disposition    ED Disposition Condition Comment   Admit  [facility] [[id number]]  Level of Care: Med-Surg [16]  I expect the patient will be discharged within 24 hours: No (not a candidate for 5C-Observation unit)  Covid Evaluation: Confirmed COVID Positive  Diagnosis: COVID-19 virus infection [[id number]][[phone]]  Admitting Physician: Charlsie QuestPATEL, VISHAL R [[id number]]  Attending Physician: Charlsie QuestPATEL, VISHAL R [[id number]]  PT Class (Do Not Modify): Observation [104]  PT Acc Code (Do Not Modify): Observation [10022]       B Medical/Surgery History Past Medical History:  Diagnosis Date  . Allergy    No past surgical history on file.   A IV Location/Drains/Wounds Patient Lines/Drains/Airways Status   Active Line/Drains/Airways    Name:   Placement date:   Placement time:   Site:   Days:   Peripheral IV 05/03/19 Left Antecubital   05/03/19    1618    Antecubital   less than 1          Intake/Output Last 24 hours No intake or output data in the 24 hours ending 05/03/19 2037  Labs/Imaging Results for orders placed or performed during the hospital encounter of 05/03/19 (from the past 48 hour(s))  SARS Coronavirus 2 [facility](Hospital order, Performed in [facility] hospital lab)     Status: Abnormal   Collection Time: 05/03/19  4:20 PM   Specimen: Nasopharyngeal Swab  Result Value Ref Range   SARS Coronavirus 2 POSITIVE (A) NEGATIVE    Comment: CRITICAL RESULT CALLED TO, READ BACK BY AND VERIFIED WITH: RN  MICHELLE C. 1821 [id number]  FCP (NOTE) If result is NEGATIVE SARS-CoV-2 target nucleic acids are NOT DETECTED. The SARS-CoV-2 RNA is generally detectable in upper and lower  respiratory specimens during the acute phase of infection. The lowest  concentration of SARS-CoV-2 viral copies this assay can detect is 250  copies / mL. A negative result does not preclude SARS-CoV-2 infection  and should not be used as the sole basis for treatment or other  patient management decisions.  A negative result may occur with  improper specimen collection / handling, submission of specimen other  than nasopharyngeal swab, presence of viral mutation(s) within the  areas targeted by this assay, and inadequate number of viral copies  (<250 copies / mL). A negative result must be combined with clinical  observations, patient history, and epidemiological information. If result is POSITIVE SARS-CoV-2 target nucleic acids are DETECTE D. The SARS-CoV-2 RNA is generally detectable in upper and lower  respiratory specimens during the acute phase of infection.  Positive  results are indicative of active infection with SARS-CoV-2.  Clinical  correlation with patient history and other diagnostic information is  necessary to determine patient infection status.  Positive results do  not rule out bacterial infection or co-infection with other viruses. If result is PRESUMPTIVE POSTIVE SARS-CoV-2 nucleic acids MAY BE PRESENT.   A presumptive positive result was obtained on the submitted specimen  and confirmed on repeat testing.  While 2019 novel coronavirus  (  SARS-CoV-2) nucleic acids may be present in the submitted sample  additional confirmatory testing may be necessary for epidemiological  and / or clinical management purposes  to differentiate between  SARS-CoV-2 and other Sarbecovirus currently known to infect humans.  If clinically indicated additional testing with an alternate test  methodology (LAB745 3) is  advised. The SARS-CoV-2 RNA is generally  detectable in upper and lower respiratory specimens during the acute  phase of infection. The expected result is Negative. Fact Sheet for Patients:  BoilerBrush.com.cy Fact Sheet for Healthcare Providers: https://pope.com/ This test is not yet approved or cleared by the [place] FDA and has been authorized for detection and/or diagnosis of SARS-CoV-2 by FDA under an Emergency Use Authorization (EUA).  This EUA will remain in effect (meaning this test can be used) for the duration of the COVID-19 declaration under Section 564(b)(1) of the Act, 21 U.S.C. section 360bbb-3(b)(1), unless the authorization is terminated or revoked sooner. Performed at [facility], [address], [place], [place] [address]   CBC WITH DIFFERENTIAL     Status: Abnormal   Collection Time: 05/03/19  4:20 PM  Result Value Ref Range   WBC 3.8 (L) 4.0 - 10.5 K/uL   RBC 3.89 (L) 4.22 - 5.81 MIL/uL   Hemoglobin 12.4 (L) 13.0 - 17.0 g/dL   HCT 91.4 78.2 - 95.6 %   MCV 100.3 (H) 80.0 - 100.0 fL   MCH 31.9 26.0 - 34.0 pg   MCHC 31.8 30.0 - 36.0 g/dL   RDW 21.3 08.6 - 57.8 %   Platelets 152 150 - 400 K/uL   nRBC 0.0 0.0 - 0.2 %   Neutrophils Relative % 41 %   Neutro Abs 1.6 (L) 1.7 - 7.7 K/uL   Lymphocytes Relative 49 %   Lymphs Abs 1.9 0.7 - 4.0 K/uL   Monocytes Relative 8 %   Monocytes Absolute 0.3 0.1 - 1.0 K/uL   Eosinophils Relative 2 %   Eosinophils Absolute 0.1 0.0 - 0.5 K/uL   Basophils Relative 0 %   Basophils Absolute 0.0 0.0 - 0.1 K/uL   Immature Granulocytes 0 %   Abs Immature Granulocytes 0.01 0.00 - 0.07 K/uL    Comment: Performed at [facility], [address] [place], [place] [address]  Comprehensive metabolic panel     Status: Abnormal   Collection Time: 05/03/19  4:20 PM  Result Value Ref Range   Sodium 138 135 - 145 mmol/L   Potassium 3.6 3.5 - 5.1 mmol/L   Chloride 104 98 - 111  mmol/L   CO2 24 22 - 32 mmol/L   Glucose, Bld 91 70 - 99 mg/dL   BUN 10 6 - 20 mg/dL   Creatinine, Ser 9.52 0.61 - 1.24 mg/dL   Calcium 8.9 8.9 - 84.1 mg/dL   Total Protein 7.1 6.5 - 8.1 g/dL   Albumin 3.4 (L) 3.5 - 5.0 g/dL   AST 59 (H) 15 - 41 U/L   ALT 92 (H) 0 - 44 U/L   Alkaline Phosphatase 59 38 - 126 U/L   Total Bilirubin 0.8 0.3 - 1.2 mg/dL   GFR calc non Af Amer >60 >60 mL/min   GFR calc Af Amer >60 >60 mL/min   Anion gap 10 5 - 15    Comment: Performed at [facility], [address], [place], [place] [address]  Procalcitonin     Status: None   Collection Time: 05/03/19  4:20 PM  Result  Value Ref Range   Procalcitonin 0.12 ng/mL    Comment:        Interpretation: PCT (Procalcitonin) <= 0.5 ng/mL: Systemic infection (sepsis) is not likely. Local bacterial infection is possible. (NOTE)       Sepsis PCT Algorithm           Lower Respiratory Tract                                      Infection PCT Algorithm    ----------------------------     ----------------------------         PCT < 0.25 ng/mL                PCT < 0.10 ng/mL         Strongly encourage             Strongly discourage   discontinuation of antibiotics    initiation of antibiotics    ----------------------------     -----------------------------       PCT 0.25 - 0.50 ng/mL            PCT 0.10 - 0.25 ng/mL               OR       >80% decrease in PCT            Discourage initiation of                                            antibiotics      Encourage discontinuation           of antibiotics    ----------------------------     -----------------------------         PCT >= 0.50 ng/mL              PCT 0.26 - 0.50 ng/mL               AND        <80% decrease in PCT             Encourage initiation of                                             antibiotics       Encourage continuation           of antibiotics    ----------------------------     -----------------------------        PCT >= 0.50  ng/mL                  PCT > 0.50 ng/mL               AND         increase in PCT                  Strongly encourage                                      initiation of antibiotics    Strongly encourage escalation  of antibiotics                                     -----------------------------                                           PCT <= 0.25 ng/mL                                                 OR                                        > 80% decrease in PCT                                     Discontinue / Do not initiate                                             antibiotics Performed at [facility], [address] [place], [place] [address]   Lactate dehydrogenase     Status: Abnormal   Collection Time: 05/03/19  4:20 PM  Result Value Ref Range   LDH 218 (H) 98 - 192 U/L    Comment: Performed at [facility], [address] [place], [place] [address]  Ferritin     Status: Abnormal   Collection Time: 05/03/19  4:20 PM  Result Value Ref Range   Ferritin 795 (H) 24 - 336 ng/mL    Comment: Performed at [facility], [address] [place], [place] [address]  C-reactive protein     Status: Abnormal   Collection Time: 05/03/19  4:20 PM  Result Value Ref Range   CRP 3.6 (H) <1.0 mg/dL    Comment: Performed at [facility], [address] [place], [place] [address]  Triglycerides     Status: None   Collection Time: 05/03/19  4:20 PM  Result Value Ref Range   Triglycerides 131 <150 mg/dL    Comment: Performed at [facility], [address] [place], [place] [address]  Lactic acid, plasma     Status: None   Collection Time: 05/03/19  5:22 PM  Result Value Ref Range   Lactic Acid, Venous 0.9 0.5 - 1.9 mmol/L    Comment: Performed at [facility], [address] [place], [place] [address]   Dg Chest Port 1 View  Result Date: 05/03/2019 CLINICAL DATA:  Positive COVID-19 test with fatigue and productive cough, initial encounter  EXAM: PORTABLE CHEST 1 VIEW COMPARISON:  10/20/2014 FINDINGS: Cardiac shadow is stable. The lungs are well aerated bilaterally. No focal infiltrate or sizable effusion is seen. No bony abnormality is noted. IMPRESSION: No acute abnormality noted. Electronically Signed   By: Alcide CleverMark  Lukens M.D.   On: 05/03/2019 16:54    Pending Labs Unresulted Labs (From admission, onward)  Start     Ordered   05/03/19 1522  Blood Culture (routine x 2)  BLOOD CULTURE X 2,   STAT     05/03/19 1523          Vitals/Pain Today's Vitals   05/03/19 1615 05/03/19 1630 05/03/19 1645 05/03/19 1715  BP: (!) 153/96 (!) 168/100 (!) 162/98 (!) 170/105  Pulse:  (!) 57 (!) 55 (!) 59  Resp: 17 (!) 22 (!) 25 17  Temp:      TempSrc:      SpO2:  96% 97% 98%    Isolation Precautions Airborne and Contact precautions  Medications Medications - No data to display  Mobility walks Low fall risk   Focused Assessments Pulmonary Assessment Handoff:  Lung sounds:   O2 Device: Room Air        R Recommendations: See Admitting Provider Note  Report given to:   Additional Notes:

## 2019-05-04 LAB — CBC WITH DIFFERENTIAL/PLATELET
Abs Immature Granulocytes: 0.01 10*3/uL (ref 0.00–0.07)
Basophils Absolute: 0 10*3/uL (ref 0.0–0.1)
Basophils Relative: 0 %
Eosinophils Absolute: 0 10*3/uL (ref 0.0–0.5)
Eosinophils Relative: 0 %
HCT: 42 % (ref 39.0–52.0)
Hemoglobin: 13.5 g/dL (ref 13.0–17.0)
Immature Granulocytes: 0 %
Lymphocytes Relative: 20 %
Lymphs Abs: 0.8 10*3/uL (ref 0.7–4.0)
MCH: 31.5 pg (ref 26.0–34.0)
MCHC: 32.1 g/dL (ref 30.0–36.0)
MCV: 97.9 fL (ref 80.0–100.0)
Monocytes Absolute: 0.1 10*3/uL (ref 0.1–1.0)
Monocytes Relative: 2 %
Neutro Abs: 3.1 10*3/uL (ref 1.7–7.7)
Neutrophils Relative %: 78 %
Platelets: 178 10*3/uL (ref 150–400)
RBC: 4.29 MIL/uL (ref 4.22–5.81)
RDW: 11.7 % (ref 11.5–15.5)
WBC: 4 10*3/uL (ref 4.0–10.5)
nRBC: 0 % (ref 0.0–0.2)

## 2019-05-04 LAB — COMPREHENSIVE METABOLIC PANEL
ALT: 88 U/L — ABNORMAL HIGH (ref 0–44)
AST: 53 U/L — ABNORMAL HIGH (ref 15–41)
Albumin: 3.8 g/dL (ref 3.5–5.0)
Alkaline Phosphatase: 58 U/L (ref 38–126)
Anion gap: 11 (ref 5–15)
BUN: 13 mg/dL (ref 6–20)
CO2: 21 mmol/L — ABNORMAL LOW (ref 22–32)
Calcium: 9.1 mg/dL (ref 8.9–10.3)
Chloride: 103 mmol/L (ref 98–111)
Creatinine, Ser: 1.11 mg/dL (ref 0.61–1.24)
GFR calc Af Amer: >60 mL/min (ref >60)
GFR calc non Af Amer: >60 mL/min (ref >60)
Glucose, Bld: 201 mg/dL — ABNORMAL HIGH (ref 70–99)
Potassium: 4.2 mmol/L (ref 3.5–5.1)
Sodium: 135 mmol/L (ref 135–145)
Total Bilirubin: 0.7 mg/dL (ref 0.3–1.2)
Total Protein: 7.7 g/dL (ref 6.5–8.1)

## 2019-05-04 LAB — C-REACTIVE PROTEIN: CRP: 3.1 mg/dL — ABNORMAL HIGH (ref <1.0)

## 2019-05-04 LAB — CK: Total CK: 1486 U/L — ABNORMAL HIGH (ref 49–397)

## 2019-05-04 LAB — MAGNESIUM: Magnesium: 2.1 mg/dL (ref 1.7–2.4)

## 2019-05-04 LAB — D-DIMER, QUANTITATIVE: D-Dimer, Quant: 0.55 ug/mL-FEU — ABNORMAL HIGH (ref 0.00–0.50)

## 2019-05-04 LAB — PHOSPHORUS: Phosphorus: 3.5 mg/dL (ref 2.5–4.6)

## 2019-05-04 LAB — HIV ANTIBODY (ROUTINE TESTING W REFLEX): HIV Screen 4th Generation wRfx: NONREACTIVE

## 2019-05-04 LAB — FERRITIN: Ferritin: 725 ng/mL — ABNORMAL HIGH (ref 24–336)

## 2019-05-04 MED ORDER — ASCORBIC ACID 500 MG PO TABS: 500.0000 mg | ORAL_TABLET | Freq: Every day | ORAL | 0 refills | Status: AC

## 2019-05-04 MED ORDER — PREDNISONE 50 MG PO TABS: 50.0000 mg | ORAL_TABLET | Freq: Every day | ORAL | 0 refills | Status: DC

## 2019-05-04 MED ORDER — DM-GUAIFENESIN ER 30-600 MG PO TB12: 1.0000 | ORAL_TABLET | Freq: Two times a day (BID) | ORAL | 0 refills | Status: DC

## 2019-05-04 MED ORDER — ZINC SULFATE 220 (50 ZN) MG PO CAPS: 220.0000 mg | ORAL_CAPSULE | Freq: Every day | ORAL | 0 refills | Status: DC

## 2019-05-04 MED FILL — MUCUS RELIEF DM 30-600 MG T: 30-600 | 10 days supply | Qty: 20 | Fill #0

## 2019-05-04 MED FILL — predniSONE 50 MG TABS: 50 | 6 days supply | Qty: 6 | Fill #0

## 2019-05-04 MED FILL — ZINC SULFATE 220 MG CAPSULE: 220 (50 ZN) | 20 days supply | Qty: 20 | Fill #0

## 2019-05-04 MED FILL — VITAMIN C 500 MG TABLET: 500 | 10 days supply | Qty: 20 | Fill #0

## 2019-05-04 NOTE — TOC Transition Note (Signed)
Transition of Care [facility]) - CM/SW Discharge Note   Patient Details  Name: Carl Barnett MRN: [id number] Date of Birth: 1965-12-04  [facility] CM/SW Contact:  Zenon Mayo, RN Phone Number: 05/04/2019, 1:16 PM   Clinical Narrative:    From  Home, for dc today, has no insurance and no PCP, NCM scheduled a telehealth follow up apt at Patient care center for 7/29 at 1 pm, phone number verified.  TOC pharmacy is filling his medications , cost is 20.00, patient states he has this on his card that he will use to pay for meds.  He has no other needs.   Final next level of care: Home/Self Care Barriers to Discharge: No Barriers Identified   Patient Goals and CMS Choice Patient states their goals for this hospitalization and ongoing recovery are:: get better   Choice offered to / list presented to : NA  Discharge Placement                       Discharge Plan and Services                DME Arranged: (NA)         HH Arranged: NA          Social Determinants of Health (SDOH) Interventions     Readmission Risk Interventions No flowsheet data found.

## 2019-05-04 NOTE — Discharge Instructions (Signed)
Prevent the Spread of COVID-19 if You Are Sick If you are sick with COVID-19 or think you might have COVID-19, follow the steps below to help protect other people in your home and community. Stay home except to get medical care.  Stay home. Most people with COVID-19 have mild illness and are able to recover at home without medical care. Do not leave your home, except to get medical care. Do not visit public areas.  Take care of yourself. Get rest and stay hydrated.  Get medical care when needed. Call your doctor before you go to their office for care. But, if you have trouble breathing or other concerning symptoms, call 911 for immediate help.  Avoid public transportation, ride-sharing, or taxis. Separate yourself from other people and pets in your home.  As much as possible, stay in a specific room and away from other people and pets in your home. Also, you should use a separate bathroom, if available. If you need to be around other people or animals in or outside of the home, wear a cloth face covering. ? See COVID-19 and Animals if you have questions about pets: https://www.cdc.gov/coronavirus/2019-ncov/faq.html#COVID19animals Monitor your symptoms.  Common symptoms of COVID-19 include fever and cough. Trouble breathing is a more serious symptom that means you should get medical attention.  Follow care instructions from your healthcare provider and local health department. Your local health authorities will give instructions on checking your symptoms and reporting information. If you develop emergency warning signs for COVID-19 get medical attention immediately.  Emergency warning signs include*:  Trouble breathing  Persistent pain or pressure in the chest  New confusion or not able to be woken  Bluish lips or face *This list is not all inclusive. Please consult your medical provider for any other symptoms that are severe or concerning to you. Call 911 if you have a medical  emergency. If you have a medical emergency and need to call 911, notify the operator that you have or think you might have, COVID-19. If possible, put on a facemask before medical help arrives. Call ahead before visiting your doctor.  Call ahead. Many medical visits for routine care are being postponed or done by phone or telemedicine.  If you have a medical appointment that cannot be postponed, call your doctor's office. This will help the office protect themselves and other patients. If you are sick, wear a cloth covering over your nose and mouth.  You should wear a cloth face covering over your nose and mouth if you must be around other people or animals, including pets (even at home).  You don't need to wear the cloth face covering if you are alone. If you can't put on a cloth face covering (because of trouble breathing for example), cover your coughs and sneezes in some other way. Try to stay at least 6 feet away from other people. This will help protect the people around you. Note: During the COVID-19 pandemic, medical grade facemasks are reserved for healthcare workers and some first responders. You may need to make a cloth face covering using a scarf or bandana. Cover your coughs and sneezes.  Cover your mouth and nose with a tissue when you cough or sneeze.  Throw used tissues in a lined trash can.  Immediately wash your hands with soap and water for at least 20 seconds. If soap and water are not available, clean your hands with an alcohol-based hand sanitizer that contains at least 60% alcohol. Clean your hands often.    Wash your hands often with soap and water for at least 20 seconds. This is especially important after blowing your nose, coughing, or sneezing; going to the bathroom; and before eating or preparing food.  Use hand sanitizer if soap and water are not available. Use an alcohol-based hand sanitizer with at least 60% alcohol, covering all surfaces of your hands and rubbing  them together until they feel dry.  Soap and water are the best option, especially if your hands are visibly dirty.  Avoid touching your eyes, nose, and mouth with unwashed hands. Avoid sharing personal household items.  Do not share dishes, drinking glasses, cups, eating utensils, towels, or bedding with other people in your home.  Wash these items thoroughly after using them with soap and water or put them in the dishwasher. Clean all "high-touch" surfaces everyday.  Clean and disinfect high-touch surfaces in your "sick room" and bathroom. Let someone else clean and disinfect surfaces in common areas, but not your bedroom and bathroom.  If a caregiver or other person needs to clean and disinfect a sick person's bedroom or bathroom, they should do so on an as-needed basis. The caregiver/other person should wear a mask and wait as long as possible after the sick person has used the bathroom. High-touch surfaces include phones, remote controls, counters, tabletops, doorknobs, bathroom fixtures, toilets, keyboards, tablets, and bedside tables.  Clean and disinfect areas that may have blood, stool, or body fluids on them.  Use household cleaners and disinfectants. Clean the area or item with soap and water or another detergent if it is dirty. Then use a household disinfectant. ? Be sure to follow the instructions on the label to ensure safe and effective use of the product. Many products recommend keeping the surface wet for several minutes to ensure germs are killed. Many also recommend precautions such as wearing gloves and making sure you have good ventilation during use of the product. ? Most EPA-registered household disinfectants should be effective. How to discontinue home isolation  People with COVID-19 who have stayed home (home isolated) can stop home isolation under the following conditions: ? If you will not have a test to determine if you are still contagious, you can leave home  after these three things have happened:  You have had no fever for at least 72 hours (that is three full days of no fever without the use of medicine that reduces fevers) AND  other symptoms have improved (for example, when your cough or shortness of breath has improved) AND  at least 10 days have passed since your symptoms first appeared. ? If you will be tested to determine if you are still contagious, you can leave home after these three things have happened:  You no longer have a fever (without the use of medicine that reduces fevers) AND  other symptoms have improved (for example, when your cough or shortness of breath has improved) AND  you received two negative tests in a row, 24 hours apart. Your doctor will follow CDC guidelines. In all cases, follow the guidance of your healthcare provider and local health department. The decision to stop home isolation should be made in consultation with your healthcare provider and state and local health departments. Local decisions depend on local circumstances. cdc.gov/coronavirus 02/22/2019 This information is not intended to replace advice given to you by your health care provider. Make sure you discuss any questions you have with your health care provider. Document Released: 02/03/2019 Document Revised: 03/04/2019 Document Reviewed: 02/03/2019   Elsevier Patient Education  2020 [employer]  

## 2019-05-04 NOTE — Discharge Summary (Signed)
Physician Discharge Summary  Carl Barnett RUE:[id number] DOB: [birth year] DOA: 05/03/2019  PCP: Patient, No Pcp Per  Admit date: 05/03/2019 Discharge date: 05/04/2019  Admitted From: Home Disposition: Home  Recommendations for Outpatient Follow-up:  1. Follow up with PCP in 1-2 weeks 2. Please obtain CBC/CMP at follow up 3. Please follow up on the following pending results: None  Home Health: None Equipment/Devices: None required  Discharge Condition: Stable CODE STATUS: Full code  Hospital Course: 53 year old male with history of cervical radiculopathy presenting with productive cough and associated chest pain for 2 weeks, and myalgia for 2 days.  Tested positive for COVID 19 on 04/23/2019 that resulted on 04/29/19.  However, he was unable to be contacted.  He presented to ED for new onset myalgia for 2 days.  Here he tested positive for COVID-19 in ED.  Hemodynamically stable.  Mild temp to 99.1.  Maintain good saturation on room air.  He had mild LFT and inflammatory markers including elevated CK, ferritin, CRP.  He has no lymphopenia.  Age-adjusted d-dimer within normal range.  Procalcitonin 0.12.  No acute finding on CXR.  He was given IV Solu-Medrol on admission.  Transition to p.o. prednisone and discharged on p.o. prednisone.  Patient was counseled on self-monitoring and current time at home.  He was provided with resources and self-monitoring tools prior to discharge.   Patient was ambulated on room air and maintained oxygen saturation in upper 90s without dyspnea.  See individual problem list below for more.  Discharge Diagnoses:  COVID-19 viral infection: No respiratory distress, oxygen requirement/desaturation or new CXR finding.  He is probably towards the tail end of the infection. -Discharged on prednisone for 5 more days. -Mucolytic's, vitamin C and zinc -Counseled on home self-monitoring, precautions -Patient has my chart updated.  Elevated blood pressure: Improved  prior to discharge. -Outpatient follow-up.  Discharge Instructions  Discharge Instructions    Call MD for:  difficulty breathing, headache or visual disturbances   Complete by: As directed    Call MD for:  extreme fatigue   Complete by: As directed    Call MD for:  persistant dizziness or light-headedness   Complete by: As directed    Call MD for:  temperature >100.4   Complete by: As directed    Diet general   Complete by: As directed    Discharge instructions   Complete by: As directed    Increase activity slowly   Complete by: As directed    [place] COVID-19 home monitoring program   Complete by: May 04, 2019    Is the patient willing to use the [facility] for home monitoring?: Yes   Temperature monitoring   Complete by: May 04, 2019    After how many days would you like to receive a notification of this patient's flowsheet entries?: 1     Allergies as of 05/04/2019   No Known Allergies     Medication List    STOP taking these medications   guaiFENesin 600 MG 12 hr tablet Commonly known as: MUCINEX   sildenafil 20 MG tablet Commonly known as: REVATIO   THERAFLU FLU/CHEST CONGESTION PO     TAKE these medications   Albuterol Sulfate 108 (90 Base) MCG/ACT Aepb Inhale 2 puffs into the lungs every 4 (four) hours as needed (for shortness of breath or wheezing).   ascorbic acid 500 MG tablet Commonly known as: VITAMIN C Take 1 tablet (500 mg total) by mouth daily. Start taking on: May 05, 2019   dextromethorphan-guaiFENesin 30-600 MG 12hr tablet Commonly known as: MUCINEX DM Take 1 tablet by mouth 2 (two) times daily.   predniSONE 50 MG tablet Commonly known as: DELTASONE Take 1 tablet (50 mg total) by mouth daily with breakfast. Start taking on: May 05, 2019   zinc sulfate 220 (50 Zn) MG capsule Take 1 capsule (220 mg total) by mouth daily. Start taking on: May 05, 2019        Consultations:  None  Procedures/Studies:  2D Echo: None   Dg Chest Port 1 View  Result Date: 05/03/2019 CLINICAL DATA:  Positive COVID-19 test with fatigue and productive cough, initial encounter EXAM: PORTABLE CHEST 1 VIEW COMPARISON:  10/20/2014 FINDINGS: Cardiac shadow is stable. The lungs are well aerated bilaterally. No focal infiltrate or sizable effusion is seen. No bony abnormality is noted. IMPRESSION: No acute abnormality noted. Electronically Signed   By: Alcide CleverMark  Lukens M.D.   On: 05/03/2019 16:54      Subjective: No major events overnight of this morning.  Continues to have some productive cough with grayish to dark phlegm but no occult hemoptysis.  Denies chest pain or dyspnea.  No GI or GU symptoms.  Ambulated on room air without desaturation or distress.  Ready to go home.  Already has MyChart activated.  Encouraged on self monitoring and precautions.   Discharge Exam: Vitals:   05/04/19 0300 05/04/19 0700  BP: 134/79 (!) 156/98  Pulse: (!) 58 70  Resp: 16 17  Temp: 97.8 F (36.6 C) 97.9 F (36.6 C)  SpO2: 100% 99%    GENERAL: No acute distress.  Appears well.  HEENT: MMM.  Vision and hearing grossly intact.  NECK: Supple.  No JVD.  LUNGS:  No IWOB. Good air movement bilaterally. HEART:  RRR. Heart sounds normal.  ABD: Bowel sounds present. Soft. Non tender.  MSK/EXT:  Moves all extremities. No apparent deformity. No edema bilaterally. SKIN: no apparent skin lesion or wound NEURO: Awake, alert and oriented appropriately.  No gross deficit.  PSYCH: Calm. Normal affect.   The results of significant diagnostics from this hospitalization (including imaging, microbiology, ancillary and laboratory) are listed below for reference.     Microbiology: Recent Results (from the past 240 hour(s))  SARS Coronavirus 2 [facility](Hospital order, Performed in [facility] lab)     Status: Abnormal   Collection Time: 05/03/19  4:20 PM   Specimen: Nasopharyngeal Swab  Result Value Ref Range Status   SARS Coronavirus 2 POSITIVE (A)  NEGATIVE Final    Comment: CRITICAL RESULT CALLED TO, READ BACK BY AND VERIFIED WITH: RN MICHELLE C. 1821 [id number]  FCP (NOTE) If result is NEGATIVE SARS-CoV-2 target nucleic acids are NOT DETECTED. The SARS-CoV-2 RNA is generally detectable in upper and lower  respiratory specimens during the acute phase of infection. The lowest  concentration of SARS-CoV-2 viral copies this assay can detect is 250  copies / mL. A negative result does not preclude SARS-CoV-2 infection  and should not be used as the sole basis for treatment or other  patient management decisions.  A negative result may occur with  improper specimen collection / handling, submission of specimen other  than nasopharyngeal swab, presence of viral mutation(s) within the  areas targeted by this assay, and inadequate number of viral copies  (<250 copies / mL). A negative result must be combined with clinical  observations, patient history, and epidemiological information. If result is POSITIVE SARS-CoV-2 target nucleic acids are DETECTE D. The  SARS-CoV-2 RNA is generally detectable in upper and lower  respiratory specimens during the acute phase of infection.  Positive  results are indicative of active infection with SARS-CoV-2.  Clinical  correlation with patient history and other diagnostic information is  necessary to determine patient infection status.  Positive results do  not rule out bacterial infection or co-infection with other viruses. If result is PRESUMPTIVE POSTIVE SARS-CoV-2 nucleic acids MAY BE PRESENT.   A presumptive positive result was obtained on the submitted specimen  and confirmed on repeat testing.  While 2019 novel coronavirus  (SARS-CoV-2) nucleic acids may be present in the submitted sample  additional confirmatory testing may be necessary for epidemiological  and / or clinical management purposes  to differentiate between  SARS-CoV-2 and other Sarbecovirus currently known to infect humans.  If  clinically indicated additional testing with an alternate test  methodology (LAB745 3) is advised. The SARS-CoV-2 RNA is generally  detectable in upper and lower respiratory specimens during the acute  phase of infection. The expected result is Negative. Fact Sheet for Patients:  BoilerBrush.com.cyhttps://www.fda.gov/media/136312/download Fact Sheet for Healthcare Providers: https://pope.com/https://www.fda.gov/media/136313/download This test is not yet approved or cleared by the [place] FDA and has been authorized for detection and/or diagnosis of SARS-CoV-2 by FDA under an Emergency Use Authorization (EUA).  This EUA will remain in effect (meaning this test can be used) for the duration of the COVID-19 declaration under Section 564(b)(1) of the Act, 21 U.S.C. section 360bbb-3(b)(1), unless the authorization is terminated or revoked sooner. Performed at [facility], [address] [place], [place] [address]      Labs: BNP (last 3 results) No results for input(s): BNP in the last 8760 hours. Basic Metabolic Panel: Recent Labs  Lab 05/03/19 1620 05/04/19 0738  NA 138 135  K 3.6 4.2  CL 104 103  CO2 24 21*  GLUCOSE 91 201*  BUN 10 13  CREATININE 0.87 1.11  CALCIUM 8.9 9.1  MG  --  2.1  PHOS  --  3.5   Liver Function Tests: Recent Labs  Lab 05/03/19 1620 05/04/19 0738  AST 59* 53*  ALT 92* 88*  ALKPHOS 59 58  BILITOT 0.8 0.7  PROT 7.1 7.7  ALBUMIN 3.4* 3.8   No results for input(s): LIPASE, AMYLASE in the last 168 hours. No results for input(s): AMMONIA in the last 168 hours. CBC: Recent Labs  Lab 05/03/19 1620 05/04/19 0738  WBC 3.8* 4.0  NEUTROABS 1.6* 3.1  HGB 12.4* 13.5  HCT 39.0 42.0  MCV 100.3* 97.9  PLT 152 178   Cardiac Enzymes: Recent Labs  Lab 05/04/19 0738  CKTOTAL 1,486*   BNP: Invalid input(s): POCBNP CBG: No results for input(s): GLUCAP in the last 168 hours. D-Dimer Recent Labs    05/04/19 0738  DDIMER 0.55*   Hgb A1c No results for input(s):  HGBA1C in the last 72 hours. Lipid Profile Recent Labs    05/03/19 1620  TRIG 131   Thyroid function studies No results for input(s): TSH, T4TOTAL, T3FREE, THYROIDAB in the last 72 hours.  Invalid input(s): FREET3 Anemia work up Recent Labs    05/03/19 1620 05/04/19 0738  FERRITIN 795* 725*   Urinalysis No results found for: COLORURINE, APPEARANCEUR, LABSPEC, PHURINE, GLUCOSEU, HGBUR, BILIRUBINUR, KETONESUR, PROTEINUR, UROBILINOGEN, NITRITE, LEUKOCYTESUR Sepsis Labs Invalid input(s): PROCALCITONIN,  WBC,  LACTICIDVEN   Time coordinating discharge: 25 minutes  SIGNED:  Almon Herculesaye T Gilma Bessette, MD  [facility] 05/04/2019, 10:42 AM  If 7PM-7AM, please  contact night-coverage [email or web address] Password [email or web address]

## 2019-05-04 NOTE — Progress Notes (Signed)
SATURATION QUALIFICATIONS: (This note is used to comply with regulatory documentation for home oxygen)  Patient Saturations on Room Air at Rest = 99%  Patient Saturations on Room Air while Ambulating = 100%  Patient Saturations on 0 Liters of oxygen while Ambulating = 100%  Ambulated 132ft

## 2019-05-08 LAB — CULTURE, BLOOD (ROUTINE X 2)
Culture: NO GROWTH
Culture: NO GROWTH

## 2019-05-20 ENCOUNTER — Other Ambulatory Visit: Payer: Self-pay

## 2019-05-20 ENCOUNTER — Encounter: Payer: Self-pay | Admitting: Family Medicine

## 2019-05-20 ENCOUNTER — Ambulatory Visit (INDEPENDENT_AMBULATORY_CARE_PROVIDER_SITE_OTHER): Payer: Self-pay | Admitting: Family Medicine

## 2019-05-20 VITALS — BP 132/62 | HR 76 | Temp 98.3°F | Resp 16 | Ht 75.0 in | Wt 213.0 lb

## 2019-05-20 DIAGNOSIS — Z23 Encounter for immunization: Secondary | ICD-10-CM

## 2019-05-20 DIAGNOSIS — Z131 Encounter for screening for diabetes mellitus: Secondary | ICD-10-CM

## 2019-05-20 DIAGNOSIS — Z7689 Persons encountering health services in other specified circumstances: Secondary | ICD-10-CM

## 2019-05-20 DIAGNOSIS — U071 COVID-19: Secondary | ICD-10-CM

## 2019-05-20 DIAGNOSIS — E119 Type 2 diabetes mellitus without complications: Secondary | ICD-10-CM

## 2019-05-20 LAB — POCT GLYCOSYLATED HEMOGLOBIN (HGB A1C): Hemoglobin A1C: 6.6 % — AB (ref 4.0–5.6)

## 2019-05-20 NOTE — Progress Notes (Signed)
Patient [facility]  New Patient Encounter Provider: Lanae Boast, Cornelius    CBJ:[id number]  HYW:[id number]  DOB - 24-Jan-1966  SUBJECTIVE:   Carl Barnett, is a 53 y.o. male who presents to establish care with this clinic.   Current problems/concerns:   Patient hospitalized for positive COVID 19 05/03/2019-05/04/2019. He states that his respiratory symptoms have improved. He denies fever, chills or night sweats. He has been pre-diabetic in the past and has followed a carb modified diet. He states that he has increased physical activity for exercise in the past few weeks.   No Known Allergies Past Medical History:  Diagnosis Date  . Allergy   . Scoliosis    Current Outpatient Medications on File Prior to Visit  Medication Sig Dispense Refill  . Albuterol Sulfate 108 (90 Base) MCG/ACT AEPB Inhale 2 puffs into the lungs every 4 (four) hours as needed (for shortness of breath or wheezing).    Marland Kitchen dextromethorphan-guaiFENesin (MUCINEX DM) 30-600 MG 12hr tablet Take 1 tablet by mouth 2 (two) times daily. 20 tablet 0  . vitamin C (VITAMIN C) 500 MG tablet Take 1 tablet (500 mg total) by mouth daily. 20 tablet 0  . zinc sulfate 220 (50 Zn) MG capsule Take 1 capsule (220 mg total) by mouth daily. 20 capsule 0   No current facility-administered medications on file prior to visit.    Family History  Problem Relation Age of Onset  . Diabetes Mother   . Stroke Mother   . Diabetes Father   . Hyperlipidemia Father   . Hypertension Father    Social History   Socioeconomic History  . Marital status: Single    Spouse name: Not on file  . Number of children: Not on file  . Years of education: Not on file  . Highest education level: Not on file  Occupational History  . Not on file  Social Needs  . Financial resource strain: Not on file  . Food insecurity    Worry: Not on file    Inability: Not on file  . Transportation needs    Medical: Not on  file    Non-medical: Not on file  Tobacco Use  . Smoking status: Never Smoker  . Smokeless tobacco: Never Used  Substance and Sexual Activity  . Alcohol use: No    Alcohol/week: 0.0 standard drinks  . Drug use: No  . Sexual activity: Not on file  Lifestyle  . Physical activity    Days per week: Not on file    Minutes per session: Not on file  . Stress: Not on file  Relationships  . Social Herbalist on phone: Not on file    Gets together: Not on file    Attends religious service: Not on file    Active member of club or organization: Not on file    Attends meetings of clubs or organizations: Not on file    Relationship status: Not on file  . Intimate partner violence    Fear of current or ex partner: Not on file    Emotionally abused: Not on file    Physically abused: Not on file    Forced sexual activity: Not on file  Other Topics Concern  . Not on file  Social History Narrative  . Not on file    Review of Systems  Constitutional: Negative.   HENT: Negative.   Eyes: Negative.   Respiratory: Negative.   Cardiovascular: Negative.  Gastrointestinal: Negative.   Genitourinary: Negative.   Musculoskeletal: Negative.   Skin: Negative.   Neurological: Negative.   Psychiatric/Behavioral: Negative.      OBJECTIVE:    BP 132/62   Pulse 76   Temp 98.3 F (36.8 C) (Oral)   Resp 16   Ht 6\' 3"  (1.905 m)   Wt 213 lb (96.6 kg)   SpO2 100%   BMI 26.62 kg/m   Physical Exam  Constitutional: He is oriented to person, place, and time and well-developed, well-nourished, and in no distress. No distress.  HENT:  Head: Normocephalic and atraumatic.  Eyes: Pupils are equal, round, and reactive to light. Conjunctivae and EOM are normal.  Neck: Normal range of motion. Neck supple.  Cardiovascular: Normal rate, regular rhythm and intact distal pulses. Exam reveals no gallop and no friction rub.  No murmur heard. Pulmonary/Chest: Effort normal and breath sounds  normal. No respiratory distress. He has no wheezes.  Abdominal: Soft. Bowel sounds are normal. There is no abdominal tenderness.  Musculoskeletal: Normal range of motion.        General: No tenderness or edema.  Lymphadenopathy:    He has no cervical adenopathy.  Neurological: He is alert and oriented to person, place, and time. Gait normal.  Skin: Skin is warm and dry.  Psychiatric: Mood, memory, affect and judgment normal.  Nursing note and vitals reviewed.    ASSESSMENT/PLAN:  1. Encounter to establish care - CBC with Differential - Comprehensive metabolic panel  2. Type 2 diabetes mellitus without complication, without long-term current use of insulin (HCC) Patient with A1c in diabetic range. He is at goal with reading under 7. He would like to control with diet and exercise at the present time. Will monitor for changes. Discussed signs and symptoms of hyper and hypoglycemia. Handouts given for new diabetic patient.  - HgB A1c- 6.6  3. COVID-19 virus infection Resolved  Return in about 3 months (around 08/20/2019) for DM2.  The patient was given clear instructions to go to ER or return to medical center if symptoms don't improve, worsen or new problems develop. The patient verbalized understanding. The patient was told to call to get lab results if they haven't heard anything in the next week.     This note has been created with Education officer, environmentalDragon speech recognition software and smart phrase technology. Any transcriptional errors are unintentional.   Ms. Andr L. Riley Lamouglas, FNP-BC [facility] [address]  , [place] [address] [phone]

## 2019-05-20 NOTE — Patient Instructions (Signed)
Diabetes Mellitus and Standards of Medical Care Managing diabetes (diabetes mellitus) can be complicated. Your diabetes treatment may be managed by a team of health care providers, including:  A physician who specializes in diabetes (endocrinologist).  A nurse practitioner or physician assistant.  Nurses.  A diet and nutrition specialist (registered dietitian).  A certified diabetes educator (CDE).  An exercise specialist.  A pharmacist.  An eye doctor.  A foot specialist (podiatrist).  A dentist.  A primary care provider.  A mental health provider. Your health care providers follow guidelines to help you get the best quality of care. The following schedule is a general guideline for your diabetes management plan. Your health care providers may give you more specific instructions. Physical exams Upon being diagnosed with diabetes mellitus, and each year after that, your health care provider will ask about your medical and family history. He or she will also do a physical exam. Your exam may include:  Measuring your height, weight, and body mass index (BMI).  Checking your blood pressure. This will be done at every routine medical visit. Your target blood pressure may vary depending on your medical conditions, your age, and other factors.  Thyroid gland exam.  Skin exam.  Screening for damage to your nerves (peripheral neuropathy). This may include checking the pulse in your legs and feet and checking the level of sensation in your hands and feet.  A complete foot exam to inspect the structure and skin of your feet, including checking for cuts, bruises, redness, blisters, sores, or other problems.  Screening for blood vessel (vascular) problems, which may include checking the pulse in your legs and feet and checking your temperature. Blood tests Depending on your treatment plan and your personal needs, you may have the following tests done:  HbA1c (hemoglobin A1c). This  test provides information about blood sugar (glucose) control over the previous 2-3 months. It is used to adjust your treatment plan, if needed. This test will be done: ? At least 2 times a year, if you are meeting your treatment goals. ? 4 times a year, if you are not meeting your treatment goals or if treatment goals have changed.  Lipid testing, including total, LDL, and HDL cholesterol and triglyceride levels. ? The goal for LDL is less than 100 mg/dL (5.5 mmol/L). If you are at high risk for complications, the goal is less than 70 mg/dL (3.9 mmol/L). ? The goal for HDL is 40 mg/dL (2.2 mmol/L) or higher for men and 50 mg/dL (2.8 mmol/L) or higher for women. An HDL cholesterol of 60 mg/dL (3.3 mmol/L) or higher gives some protection against heart disease. ? The goal for triglycerides is less than 150 mg/dL (8.3 mmol/L).  Liver function tests.  Kidney function tests.  Thyroid function tests. Dental and eye exams  Visit your dentist two times a year.  If you have type 1 diabetes, your health care provider may recommend an eye exam 3-5 years after you are diagnosed, and then once a year after your first exam. ? For children with type 1 diabetes, a health care provider may recommend an eye exam when your child is age 10 or older and has had diabetes for 3-5 years. After the first exam, your child should get an eye exam once a year.  If you have type 2 diabetes, your health care provider may recommend an eye exam as soon as you are diagnosed, and then once a year after your first exam. Immunizations   The   yearly flu (influenza) vaccine is recommended for everyone 6 months or older who has diabetes.  The pneumonia (pneumococcal) vaccine is recommended for everyone 2 years or older who has diabetes. If you are 10 or older, you may get the pneumonia vaccine as a series of two separate shots.  The hepatitis B vaccine is recommended for adults shortly after being diagnosed with diabetes.   Adults and children with diabetes should receive all other vaccines according to age-specific recommendations from the Centers for Disease Control and Prevention (CDC). Mental and emotional health Screening for symptoms of eating disorders, anxiety, and depression is recommended at the time of diagnosis and afterward as needed. If your screening shows that you have symptoms (positive screening result), you may need more evaluation and you may work with a mental health care provider. Treatment plan Your treatment plan will be reviewed at every medical visit. You and your health care provider will discuss:  How you are taking your medicines, including insulin.  Any side effects you are experiencing.  Your blood glucose target goals.  The frequency of your blood glucose monitoring.  Lifestyle habits, such as activity level as well as tobacco, alcohol, and substance use. Diabetes self-management education Your health care provider will assess how well you are monitoring your blood glucose levels and whether you are taking your insulin correctly. He or she may refer you to:  A certified diabetes educator to manage your diabetes throughout your life, starting at diagnosis.  A registered dietitian who can create or review your personal nutrition plan.  An exercise specialist who can discuss your activity level and exercise plan. Summary  Managing diabetes (diabetes mellitus) can be complicated. Your diabetes treatment may be managed by a team of health care providers.  Your health care providers follow guidelines in order to help you get the best quality of care.  Standards of care including having regular physical exams, blood tests, blood pressure monitoring, immunizations, screening tests, and education about how to manage your diabetes.  Your health care providers may also give you more specific instructions based on your individual health. This information is not intended to replace  advice given to you by your health care provider. Make sure you discuss any questions you have with your health care provider. Document Released: 08/05/2009 Document Revised: 06/27/2018 Document Reviewed: 07/06/2016 Elsevier Patient Education  2020 [place] Maintenance, Male Adopting a healthy lifestyle and getting preventive care are important in promoting health and wellness. Ask your health care provider about:  The right schedule for you to have regular tests and exams.  Things you can do on your own to prevent diseases and keep yourself healthy. What should I know about diet, weight, and exercise? Eat a healthy diet   Eat a diet that includes plenty of vegetables, fruits, low-fat dairy products, and lean protein.  Do not eat a lot of foods that are high in solid fats, added sugars, or sodium. Maintain a healthy weight Body mass index (BMI) is a measurement that can be used to identify possible weight problems. It estimates body fat based on height and weight. Your health care provider can help determine your BMI and help you achieve or maintain a healthy weight. Get regular exercise Get regular exercise. This is one of the most important things you can do for your health. Most adults should:  Exercise for at least 150 minutes each week. The exercise should increase your heart rate and make you sweat (moderate-intensity exercise).  Do  strengthening exercises at least twice a week. This is in addition to the moderate-intensity exercise.  Spend less time sitting. Even light physical activity can be beneficial. Watch cholesterol and blood lipids Have your blood tested for lipids and cholesterol at 55 years of age, then have this test every 5 years. You may need to have your cholesterol levels checked more often if:  Your lipid or cholesterol levels are high.  You are older than 55 years of age.  You are at high risk for heart disease. What should I know about cancer  screening? Many types of cancers can be detected early and may often be prevented. Depending on your health history and family history, you may need to have cancer screening at various ages. This may include screening for:  Colorectal cancer.  Prostate cancer.  Skin cancer.  Lung cancer. What should I know about heart disease, diabetes, and high blood pressure? Blood pressure and heart disease  High blood pressure causes heart disease and increases the risk of stroke. This is more likely to develop in people who have high blood pressure readings, are of African descent, or are overweight.  Talk with your health care provider about your target blood pressure readings.  Have your blood pressure checked: ? Every 3-5 years if you are 2018-55 years of age. ? Every year if you are 55 years old or older.  If you are between the ages of 5765 and 7075 and are a current or former smoker, ask your health care provider if you should have a one-time screening for abdominal aortic aneurysm (AAA). Diabetes Have regular diabetes screenings. This checks your fasting blood sugar level. Have the screening done:  Once every three years after age 55 if you are at a normal weight and have a low risk for diabetes.  More often and at a younger age if you are overweight or have a high risk for diabetes. What should I know about preventing infection? Hepatitis B If you have a higher risk for hepatitis B, you should be screened for this virus. Talk with your health care provider to find out if you are at risk for hepatitis B infection. Hepatitis C Blood testing is recommended for:  Everyone born from [birth year] through 1967.  Anyone with known risk factors for hepatitis C. Sexually transmitted infections (STIs)  You should be screened each year for STIs, including gonorrhea and chlamydia, if: ? You are sexually active and are younger than 55 years of age. ? You are older than 55 years of age and your health care  provider tells you that you are at risk for this type of infection. ? Your sexual activity has changed since you were last screened, and you are at increased risk for chlamydia or gonorrhea. Ask your health care provider if you are at risk.  Ask your health care provider about whether you are at high risk for HIV. Your health care provider may recommend a prescription medicine to help prevent HIV infection. If you choose to take medicine to prevent HIV, you should first get tested for HIV. You should then be tested every 3 months for as long as you are taking the medicine. Follow these instructions at home: Lifestyle  Do not use any products that contain nicotine or tobacco, such as cigarettes, e-cigarettes, and chewing tobacco. If you need help quitting, ask your health care provider.  Do not use street drugs.  Do not share needles.  Ask your health care provider for help  if you need support or information about quitting drugs. Alcohol use  Do not drink alcohol if your health care provider tells you not to drink.  If you drink alcohol: ? Limit how much you have to 0-2 drinks a day. ? Be aware of how much alcohol is in your drink. In the [place] one drink equals one 12 oz bottle of beer (355 mL), one 5 oz glass of wine (148 mL), or one 1 oz glass of hard liquor (44 mL). General instructions  Schedule regular health, dental, and eye exams.  Stay current with your vaccines.  Tell your health care provider if: ? You often feel depressed. ? You have ever been abused or do not feel safe at home. Summary  Adopting a healthy lifestyle and getting preventive care are important in promoting health and wellness.  Follow your health care provider's instructions about healthy diet, exercising, and getting tested or screened for diseases.  Follow your health care provider's instructions on monitoring your cholesterol and blood pressure. This information is not intended to replace advice given  to you by your health care provider. Make sure you discuss any questions you have with your health care provider. Document Released: 04/05/2008 Document Revised: 10/01/2018 Document Reviewed: 10/01/2018 Elsevier Patient Education  2020 [employer]

## 2019-06-12 ENCOUNTER — Encounter (HOSPITAL_COMMUNITY): Payer: Self-pay

## 2019-07-01 ENCOUNTER — Encounter (HOSPITAL_COMMUNITY): Payer: Self-pay | Admitting: *Deleted

## 2019-07-01 ENCOUNTER — Encounter (HOSPITAL_COMMUNITY): Payer: Self-pay

## 2019-08-20 ENCOUNTER — Ambulatory Visit: Payer: Self-pay | Admitting: Family Medicine

## 2019-09-01 ENCOUNTER — Ambulatory Visit: Payer: Self-pay | Admitting: Family Medicine

## 2019-10-18 ENCOUNTER — Emergency Department (HOSPITAL_COMMUNITY): Payer: Self-pay

## 2019-10-18 ENCOUNTER — Other Ambulatory Visit: Payer: Self-pay

## 2019-10-18 ENCOUNTER — Emergency Department (HOSPITAL_COMMUNITY)
Admission: EM | Admit: 2019-10-18 | Discharge: 2019-10-18 | Disposition: A | Discharge status: Home / Self Care | Payer: Self-pay | Attending: Emergency Medicine | Admitting: Emergency Medicine

## 2019-10-18 ENCOUNTER — Encounter (HOSPITAL_COMMUNITY): Payer: Self-pay

## 2019-10-18 DIAGNOSIS — N3000 Acute cystitis without hematuria: Secondary | ICD-10-CM | POA: Insufficient documentation

## 2019-10-18 DIAGNOSIS — Z79899 Other long term (current) drug therapy: Secondary | ICD-10-CM | POA: Insufficient documentation

## 2019-10-18 DIAGNOSIS — N451 Epididymitis: Secondary | ICD-10-CM | POA: Insufficient documentation

## 2019-10-18 LAB — URINALYSIS, ROUTINE W REFLEX MICROSCOPIC
Bilirubin Urine: NEGATIVE
Glucose, UA: NEGATIVE mg/dL
Hgb urine dipstick: NEGATIVE
Ketones, ur: NEGATIVE mg/dL
Nitrite: NEGATIVE
Protein, ur: 100 mg/dL — AB
Specific Gravity, Urine: 1.021 (ref 1.005–1.030)
pH: 9 — ABNORMAL HIGH (ref 5.0–8.0)

## 2019-10-18 LAB — COMPREHENSIVE METABOLIC PANEL
ALT: 27 U/L (ref 0–44)
AST: 22 U/L (ref 15–41)
Albumin: 3.4 g/dL — ABNORMAL LOW (ref 3.5–5.0)
Alkaline Phosphatase: 51 U/L (ref 38–126)
Anion gap: 11 (ref 5–15)
BUN: 14 mg/dL (ref 6–20)
CO2: 22 mmol/L (ref 22–32)
Calcium: 8.8 mg/dL — ABNORMAL LOW (ref 8.9–10.3)
Chloride: 98 mmol/L (ref 98–111)
Creatinine, Ser: 1.18 mg/dL (ref 0.61–1.24)
GFR calc Af Amer: >60 mL/min (ref >60)
GFR calc non Af Amer: >60 mL/min (ref >60)
Glucose, Bld: 282 mg/dL — ABNORMAL HIGH (ref 70–99)
Potassium: 3.7 mmol/L (ref 3.5–5.1)
Sodium: 131 mmol/L — ABNORMAL LOW (ref 135–145)
Total Bilirubin: 0.4 mg/dL (ref 0.3–1.2)
Total Protein: 7.9 g/dL (ref 6.5–8.1)

## 2019-10-18 LAB — CBC
HCT: 39.3 % (ref 39.0–52.0)
Hemoglobin: 12.3 g/dL — ABNORMAL LOW (ref 13.0–17.0)
MCH: 31.1 pg (ref 26.0–34.0)
MCHC: 31.3 g/dL (ref 30.0–36.0)
MCV: 99.5 fL (ref 80.0–100.0)
Platelets: 273 10*3/uL (ref 150–400)
RBC: 3.95 MIL/uL — ABNORMAL LOW (ref 4.22–5.81)
RDW: 11.8 % (ref 11.5–15.5)
WBC: 13.1 10*3/uL — ABNORMAL HIGH (ref 4.0–10.5)
nRBC: 0 % (ref 0.0–0.2)

## 2019-10-18 LAB — LIPASE, BLOOD: Lipase: 21 U/L (ref 11–51)

## 2019-10-18 MED ORDER — ONDANSETRON HCL 4 MG/2ML IJ SOLN
4.0000 mg | Freq: Once | INTRAMUSCULAR | Status: AC
  Administered 2019-10-18: 4 mg via INTRAVENOUS
  Filled 2019-10-18: qty 2

## 2019-10-18 MED ORDER — OXYCODONE-ACETAMINOPHEN 5-325 MG PO TABS: 1.0000 | ORAL_TABLET | Freq: Four times a day (QID) | ORAL | 0 refills | Status: AC | PRN

## 2019-10-18 MED ORDER — LEVOFLOXACIN 500 MG PO TABS: 500.0000 mg | ORAL_TABLET | Freq: Every day | ORAL | 0 refills | Status: DC

## 2019-10-18 MED ORDER — IOHEXOL 300 MG/ML  SOLN
100.0000 mL | Freq: Once | INTRAMUSCULAR | Status: AC | PRN
  Administered 2019-10-18: 13:00:00 100 mL via INTRAVENOUS

## 2019-10-18 MED ORDER — FENTANYL CITRATE (PF) 100 MCG/2ML IJ SOLN
50.0000 ug | Freq: Once | INTRAMUSCULAR | Status: AC
  Administered 2019-10-18: 50 ug via INTRAVENOUS
  Filled 2019-10-18: qty 2

## 2019-10-18 MED ORDER — FENTANYL CITRATE (PF) 100 MCG/2ML IJ SOLN
50.0000 ug | Freq: Once | INTRAMUSCULAR | Status: DC
  Filled 2019-10-18: qty 2

## 2019-10-18 MED ORDER — MORPHINE SULFATE (PF) 4 MG/ML IV SOLN
4.0000 mg | Freq: Once | INTRAVENOUS | Status: AC
  Administered 2019-10-18: 4 mg via INTRAVENOUS
  Filled 2019-10-18: qty 1

## 2019-10-18 MED ORDER — SODIUM CHLORIDE 0.9% FLUSH: 3.0000 mL | Freq: Once | INTRAVENOUS | Status: DC

## 2019-10-18 NOTE — Discharge Instructions (Signed)
Please take the prescribed antibiotic.  Recommend taking the pain medicine as needed for more severe pain.  For moderate pain, take Tylenol and Motrin.  Note that the prescribed pain medicine can make you drowsy and should not be taken while driving or operating heavy machinery.

## 2019-10-18 NOTE — ED Notes (Signed)
Pt verbalized understanding of discharge instructions. Follow up care and prescriptions reviewed. Pt brought to lobby to wait for son to pick him up.

## 2019-10-18 NOTE — ED Triage Notes (Signed)
Patient complains of right lower abdominal pain since christmas, reports that he now has swelling to that area with ongoing nausea

## 2019-10-18 NOTE — ED Notes (Signed)
Patient transported to Ultrasound 

## 2019-10-18 NOTE — ED Provider Notes (Signed)
[facility]   CSN: [id number] Arrival date & time: 10/18/19  1014     History No chief complaint on file.   Carl Barnett is a 53 y.o. male.  Presents to the emergency department with chief complaint of abdominal pain, testicle pain.  Patient states since Christmas he has had right lower quadrant, right testicle pain and swelling.  Pain has worsened, feels like pain radiates from his right lower abdomen to his right groin.  Currently 7 out of 10 in severity, stabbing, associated with some nausea, yesterday had some vomiting, no vomiting today.  Vomit was nonbloody nonbilious.  No dysuria.  No discharge from penis.  Sexually active, no known exposures or prior diagnoses of STDs.  No fever.  HPI     Past Medical History:  Diagnosis Date  . Allergy   . Scoliosis     Patient Active Problem List   Diagnosis Date Noted  . COVID-19 virus infection 05/03/2019    History reviewed. No pertinent surgical history.     Family History  Problem Relation Age of Onset  . Diabetes Mother   . Stroke Mother   . Diabetes Father   . Hyperlipidemia Father   . Hypertension Father     Social History   Tobacco Use  . Smoking status: Never Smoker  . Smokeless tobacco: Never Used  Substance Use Topics  . Alcohol use: No    Alcohol/week: 0.0 standard drinks  . Drug use: No    Home Medications Prior to Admission medications   Medication Sig Start Date End Date Taking? Authorizing Provider  Albuterol Sulfate 108 (90 Base) MCG/ACT AEPB Inhale 2 puffs into the lungs every 4 (four) hours as needed (for shortness of breath or wheezing).    [provider]  dextromethorphan-guaiFENesin (MUCINEX DM) 30-600 MG 12hr tablet Take 1 tablet by mouth 2 (two) times daily. 05/04/19   Almon HerculesGonfa, Taye T, MD  levofloxacin (LEVAQUIN) 500 MG tablet Take 1 tablet (500 mg total) by mouth daily for 10 days. 10/18/19 10/28/19  Milagros Lollykstra, Emerlyn Mehlhoff S, MD   oxyCODONE-acetaminophen (PERCOCET) 5-325 MG tablet Take 1 tablet by mouth every 6 (six) hours as needed for up to 3 days for severe pain. 10/18/19 10/21/19  Milagros Lollykstra, Ralynn San S, MD  vitamin C (VITAMIN C) 500 MG tablet Take 1 tablet (500 mg total) by mouth daily. 05/05/19   Almon HerculesGonfa, Taye T, MD  zinc sulfate 220 (50 Zn) MG capsule Take 1 capsule (220 mg total) by mouth daily. 05/05/19   Almon HerculesGonfa, Taye T, MD    Allergies    Patient has no known allergies.  Review of Systems   Review of Systems  Constitutional: Negative for chills and fever.  HENT: Negative for ear pain and sore throat.   Eyes: Negative for pain and visual disturbance.  Respiratory: Negative for cough and shortness of breath.   Cardiovascular: Negative for chest pain and palpitations.  Gastrointestinal: Positive for abdominal pain. Negative for vomiting.  Genitourinary: Positive for scrotal swelling and testicular pain. Negative for dysuria and hematuria.  Musculoskeletal: Negative for arthralgias and back pain.  Skin: Negative for color change and rash.  Neurological: Negative for seizures and syncope.  All other systems reviewed and are negative.   Physical Exam Updated Vital Signs BP 112/89   Pulse 67   Temp 98.7 F (37.1 C) (Oral)   Resp 15   SpO2 100%   Physical Exam Vitals and nursing note reviewed.  Constitutional:  Appearance: He is well-developed.  HENT:     Head: Normocephalic and atraumatic.  Eyes:     Conjunctiva/sclera: Conjunctivae normal.  Cardiovascular:     Rate and Rhythm: Normal rate and regular rhythm.     Heart sounds: No murmur.  Pulmonary:     Effort: Pulmonary effort is normal. No respiratory distress.     Breath sounds: Normal breath sounds.  Abdominal:     Palpations: Abdomen is soft.     Comments: Abdomen is soft, there is some tenderness in the right lower quadrant  Genitourinary:    Comments: R Testicle: Moderate swelling of right testicle, some tenderness to palpation,  cremasteric reflex intact, no erythema L testicle: normal appearing, no pain, no swelling Penis: no swelling, normal appearance, no discharge Musculoskeletal:     Cervical back: Neck supple.  Skin:    General: Skin is warm and dry.  Neurological:     Mental Status: He is alert.      Tech supervised GU exam  ED Results / Procedures / Treatments   Labs (all labs ordered are listed, but only abnormal results are displayed) Labs Reviewed  COMPREHENSIVE METABOLIC PANEL - Abnormal; Notable for the following components:      Result Value   Sodium 131 (*)    Glucose, Bld 282 (*)    Calcium 8.8 (*)    Albumin 3.4 (*)    All other components within normal limits  CBC - Abnormal; Notable for the following components:   WBC 13.1 (*)    RBC 3.95 (*)    Hemoglobin 12.3 (*)    All other components within normal limits  URINALYSIS, ROUTINE W REFLEX MICROSCOPIC - Abnormal; Notable for the following components:   APPearance TURBID (*)    pH 9.0 (*)    Protein, ur 100 (*)    Leukocytes,Ua LARGE (*)    Bacteria, UA RARE (*)    All other components within normal limits  URINE CULTURE  LIPASE, BLOOD  GC/CHLAMYDIA PROBE AMP ([place]) NOT AT [facility]    EKG None  Radiology US Scrotum  Result Date: 10/18/2019 CLINICAL DATA:  Right testicular pain and swelling. EXAM: SCROTAL ULTRASOUND DOPPLER ULTRASOUND OF THE TESTICLES TECHNIQUE: Complete ultrasound examination of the testicles, epididymis, and other scrotal structures was performed. Color and spectral Doppler ultrasound were also utilized to evaluate blood flow to the testicles. COMPARISON:  None. FINDINGS: Right testicle Measurements: 5.5 x 3.3 x 3.5 cm. No mass or microlithiasis visualized. Testicular and epididymal tissue appears hypervascular on color Doppler evaluation. Left testicle Measurements: 3.8 x 2.4 x 3.3 cm. No mass or microlithiasis visualized. Right epididymis: Heterogeneous echotexture with increased color signal on Doppler  assessment. Left epididymis:  Normal in size and appearance. Hydrocele: Complex right greater than left fluid collections noted in each hemiscrotum, containing internal septation. Varicocele:  None visualized. Pulsed Doppler interrogation of both testes demonstrates normal low resistance arterial and venous waveforms bilaterally. IMPRESSION: 1. Sonographic findings most consistent with right epididymo-orchitis. Complex fluid identified in the right hemiscrotum. 2. No evidence for testicular torsion. 3. Small volume complex fluid left hemiscrotum, indeterminate. Electronically Signed   By: Kennith Center M.D.   On: 10/18/2019 12:39   CT ABDOMEN PELVIS W CONTRAST  Result Date: 10/18/2019 CLINICAL DATA:  53 year old with a 2 day history of RIGHT LOWER QUADRANT abdominal pain and swelling in the RIGHT groin. EXAM: CT ABDOMEN AND PELVIS WITH CONTRAST TECHNIQUE: Multidetector CT imaging of the abdomen and pelvis was performed using the  standard protocol following bolus administration of intravenous contrast. CONTRAST:  160mL OMNIPAQUE IOHEXOL 300 MG/ML IV. COMPARISON:  No prior CT. Scrotal ultrasound performed earlier today is correlated. FINDINGS: Lower chest: Minimal atelectasis or scarring deep in the lower lobes. Visualized lung bases otherwise clear. Heart size upper normal. Hepatobiliary: Approximate 1.8 x 2.1 x 2.2 cm mass involving the LATERAL segment LEFT lobe peripherally demonstrating peripheral enhancement. No other hepatic parenchymal masses. Gallbladder normal in appearance without calcified gallstones. No biliary ductal dilation. Pancreas: Normal in appearance without evidence of mass, ductal dilation, or inflammation. Spleen: Normal in size and appearance. Focus of accessory splenic tissue below the spleen. Adrenals/Urinary Tract: Normal appearing adrenal glands. Kidneys normal in size and appearance without focal parenchymal abnormality. No hydronephrosis. No evidence of urinary tract calculi. Normal  appearing decompressed urinary bladder. Stomach/Bowel: Stomach normal in appearance for the degree of distention. Normal-appearing small bowel. Small bowel loops are position LATERAL to the descending colon in the mid abdomen. Normal-appearing colon with expected stool burden. Normal appearing appendix in the RIGHT mid and upper pelvis. Vascular/Lymphatic: Mild RIGHT common and internal iliac atherosclerosis. No visible aortic atherosclerosis. Widely patent visceral arteries. Normal-appearing portal venous and systemic venous systems. No pathologic lymphadenopathy. Reproductive: Prostate gland and seminal vesicles normal in size and appearance for age. Note is made of a RIGHT-sided varicocele in the inguinal canal extending to the RIGHT hemiscrotum. A moderately large RIGHT hydrocele and small LEFT hydrocele are present. No enhancing scrotal masses. Other: Very small umbilical hernia containing fat. Musculoskeletal: Severe degenerative disc disease at L5-S1. 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 Degenerative changes involving the RIGHT sacroiliac joint with partial ankylosis superiorly. No acute findings. IMPRESSION: 1. No acute abnormalities involving the abdomen or pelvis. Normal-appearing appendix. 2. RIGHT-sided varicocele which extends through the inguinal canal into the RIGHT hemiscrotum. 3. Moderately large RIGHT hydrocele and small LEFT hydrocele. No visible enhancing scrotal masses. 4. Small bowel loops LATERAL to the descending colon in the mid abdomen, query small internal hernia. 5. 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 Electronically Signed   By: Evangeline Dakin M.D.   On: 10/18/2019 13:01    Procedures Procedures (including critical care time)  Medications Ordered in ED Medications  morphine 4 MG/ML injection 4 mg (4 mg Intravenous Given 10/18/19 1153)   ondansetron (ZOFRAN) injection 4 mg (4 mg Intravenous Given 10/18/19 1153)  iohexol (OMNIPAQUE) 300 MG/ML solution 100 mL (100 mLs Intravenous Contrast Given 10/18/19 1232)  fentaNYL (SUBLIMAZE) injection 50 mcg (50 mcg Intravenous Given 10/18/19 1237)    ED Course  I have reviewed the triage vital signs and the nursing notes.  Pertinent labs & imaging results that were available during my care of the patient were reviewed by me and considered in my medical decision making (see chart for details).    MDM Rules/Calculators/A&P                      53 year old male presents to ER with right lower quadrant pain, right testicular pain and swelling.  Labs concerning for mild leukocytosis, likely UTI.  Obtained CT and ultrasound to further evaluate.  Consistent with epididymitis.  No torsion.  This fits his clinical picture.  Reviewed with pharmacy regarding antibiotic dosing.  Will start on Levaquin 500 mg daily for 10 days.  Recommend recheck with primary doctor.  Reviewed precautions, will discharge home.    After the discussed management  above, the patient was determined to be safe for discharge.  The patient was in agreement with this plan and all questions regarding their care were answered.  ED return precautions were discussed and the patient will return to the ED with any significant worsening of condition.   Final Clinical Impression(s) / ED Diagnoses Final diagnoses:  Epididymitis  Acute cystitis without hematuria    Rx / DC Orders ED Discharge Orders         Ordered    oxyCODONE-acetaminophen (PERCOCET) 5-325 MG tablet  Every 6 hours PRN     10/18/19 1325    levofloxacin (LEVAQUIN) 500 MG tablet  Daily     10/18/19 1325           Milagros Loll, MD 10/18/19 2115

## 2019-10-20 LAB — URINE CULTURE: Culture: >=100000 — AB

## 2019-10-21 ENCOUNTER — Telehealth: Payer: Self-pay

## 2019-10-21 NOTE — Telephone Encounter (Signed)
Post ED Visit - Positive Culture Follow-up  Culture report reviewed by antimicrobial stewardship pharmacist: [place] Team []  Elenor Quinones, Pharm.D. []  Heide Guile, Pharm.D., BCPS AQ-ID []  Parks Neptune, Pharm.D., BCPS []  Alycia Rossetti, Pharm.D., BCPS []  [place], Pharm.D., BCPS, AAHIVP []  Legrand Como, Pharm.D., BCPS, AAHIVP []  Salome Arnt, PharmD, BCPS []  Johnnette Gourd, PharmD, BCPS []  Hughes Better, PharmD, BCPS []  Leeroy Cha, PharmD []  Laqueta Linden, PharmD, BCPS []  Albertina Parr, PharmD [place] Team []  Leodis Sias, PharmD []  Lindell Spar, PharmD []  Royetta Asal, PharmD []  Graylin Shiver, Rph []  Rema Fendt) Glennon Mac, PharmD []  Arlyn Dunning, PharmD []  Netta Cedars, PharmD []  Dia Sitter, PharmD []  Leone Haven, PharmD []  Gretta Arab, PharmD []  Theodis Shove, PharmD []  Peggyann Juba, PharmD []  Reuel Boom, PharmD   Positive Devota Pace culture Treated with Levofloxacin, organism sensitive to the same and no further patient follow-up is required at this time.  Genia Del 10/21/2019, 12:17 PM

## 2019-11-04 ENCOUNTER — Telehealth: Payer: Self-pay | Admitting: Family Medicine

## 2019-11-04 NOTE — Telephone Encounter (Signed)
Called and spoke with patient, advised that he will have to come in for an appointment for a refill on antibiotic to be evaluated. Call was transferred to front desk to make an appointment.

## 2019-11-04 NOTE — Telephone Encounter (Signed)
error 

## 2019-11-05 ENCOUNTER — Ambulatory Visit (INDEPENDENT_AMBULATORY_CARE_PROVIDER_SITE_OTHER): Payer: Self-pay | Admitting: Nurse Practitioner

## 2019-11-05 ENCOUNTER — Encounter: Payer: Self-pay | Admitting: Nurse Practitioner

## 2019-11-05 ENCOUNTER — Other Ambulatory Visit: Payer: Self-pay

## 2019-11-05 VITALS — BP 138/90 | HR 72 | Temp 98.1°F | Resp 16 | Ht 75.0 in | Wt 216.0 lb

## 2019-11-05 DIAGNOSIS — R7303 Prediabetes: Secondary | ICD-10-CM

## 2019-11-05 DIAGNOSIS — E119 Type 2 diabetes mellitus without complications: Secondary | ICD-10-CM

## 2019-11-05 DIAGNOSIS — Z0000 Encounter for general adult medical examination without abnormal findings: Secondary | ICD-10-CM

## 2019-11-05 DIAGNOSIS — N50811 Right testicular pain: Secondary | ICD-10-CM

## 2019-11-05 DIAGNOSIS — N5089 Other specified disorders of the male genital organs: Secondary | ICD-10-CM

## 2019-11-05 LAB — POCT GLYCOSYLATED HEMOGLOBIN (HGB A1C): Hemoglobin A1C: 7.7 % — AB (ref 4.0–5.6)

## 2019-11-05 LAB — POCT URINALYSIS DIPSTICK
Bilirubin, UA: NEGATIVE
Blood, UA: NEGATIVE
Glucose, UA: NEGATIVE
Ketones, UA: NEGATIVE
Leukocytes, UA: NEGATIVE
Nitrite, UA: NEGATIVE
Protein, UA: NEGATIVE
Spec Grav, UA: 1.025
Urobilinogen, UA: 0.2 U/dL
pH, UA: 6.5

## 2019-11-05 MED ORDER — METFORMIN HCL 500 MG PO TABS: 500.0000 mg | ORAL_TABLET | Freq: Two times a day (BID) | ORAL | 3 refills | Status: DC

## 2019-11-05 MED ORDER — LEVOFLOXACIN 500 MG PO TABS: 500.0000 mg | ORAL_TABLET | Freq: Every day | ORAL | 0 refills | Status: AC

## 2019-11-05 NOTE — Progress Notes (Signed)
Established Patient Office Visit  Subjective:  Patient ID: Carl Barnett, male    DOB: 1965/11/01  Age: 54 y.o. MRN: [id number]  CC:  Chief Complaint  Patient presents with  . Groin Swelling    pain and swelling in Testical (was seen in ER [place])   . Follow-up    prediabetes     HPI Carl Barnett presents for groin pain.  He was seen in the emergency room on 10/18/2019 and diagnosed with epididymitis and started on Levaquin and oxycodone.  He admits that he completed his course of antibiotics as directed however only use Tylenol for pain.  He has noticed that the swelling has started to return. He lives alone. He has never had any scrotal issues in the past.. He denies any history of STD. He did have an injury while remodeling his home.  He was trying to remove a commode . He failed to remove all the bolts and attempted to lift the commode.  He was in severe pain.  He had to lay on the floor an one hour. He had nausea and vomiting. He denies any frequency, hesitancy urgency, dysuria, hematuria or nocturia. He denies any constipation or diarrhea. He uses probiotics 10 which is effective and drinks a lot of lemon water.  He is concerned because the swelling has returned.  He was previously diagnosed with prediabetes.  He was to follow-up in 3 months after diet and exercise however with change in a provider he failed to follow-up.  He admits that he is that he is eating lots of veggies. He has recently been on a seafood diet.  He admits that he has avoided breads and pastas however he continues to eat jasmine rice.  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  He has had a weight loss of 10 pounds.   Past Medical History:  Diagnosis Date  . Allergy   . Scoliosis     History reviewed. No pertinent surgical history.  Family History  Problem Relation Age of Onset  . Diabetes Mother   . Stroke Mother   . Diabetes  Father   . Hyperlipidemia Father   . Hypertension Father     Social History   Socioeconomic History  . Marital status: Single    Spouse name: Not on file  . Number of children: Not on file  . Years of education: Not on file  . Highest education level: Not on file  Occupational History  . Not on file  Tobacco Use  . Smoking status: Never Smoker  . Smokeless tobacco: Never Used  Substance and Sexual Activity  . Alcohol use: No    Alcohol/week: 0.0 standard drinks  . Drug use: No  . Sexual activity: Not on file  Other Topics Concern  . Not on file  Social History Narrative  . Not on file   Social Determinants of Health   Financial Resource Strain:   . Difficulty of Paying Living Expenses: Not on file  Food Insecurity:   . Worried About Programme researcher, broadcasting/film/video in the Last Year: Not on file  . Ran Out of Food in the Last Year: Not on file  Transportation Needs:   . Lack of Transportation (Medical): Not on file  . Lack of Transportation (Non-Medical): Not on file  Physical Activity:   . Days of Exercise per Week: Not on file  . Minutes of Exercise  per Session: Not on file  Stress:   . Feeling of Stress : Not on file  Social Connections:   . Frequency of Communication with Friends and Family: Not on file  . Frequency of Social Gatherings with Friends and Family: Not on file  . Attends Religious Services: Not on file  . Active Member of Clubs or Organizations: Not on file  . Attends Banker Meetings: Not on file  . Marital Status: Not on file  Intimate Partner Violence:   . Fear of Current or Ex-Partner: Not on file  . Emotionally Abused: Not on file  . Physically Abused: Not on file  . Sexually Abused: Not on file    Outpatient Medications Prior to Visit  Medication Sig Dispense Refill  . ibuprofen (ADVIL) 200 MG tablet Take 200 mg by mouth every 6 (six) hours as needed.    . Albuterol Sulfate 108 (90 Base) MCG/ACT AEPB Inhale 2 puffs into the lungs every  4 (four) hours as needed (for shortness of breath or wheezing).    . vitamin C (VITAMIN C) 500 MG tablet Take 1 tablet (500 mg total) by mouth daily. (Patient not taking: Reported on 11/05/2019) 20 tablet 0  . zinc sulfate 220 (50 Zn) MG capsule Take 1 capsule (220 mg total) by mouth daily. (Patient not taking: Reported on 11/05/2019) 20 capsule 0  . dextromethorphan-guaiFENesin (MUCINEX DM) 30-600 MG 12hr tablet Take 1 tablet by mouth 2 (two) times daily. (Patient not taking: Reported on 11/05/2019) 20 tablet 0   No facility-administered medications prior to visit.    No Known Allergies  ROS Review of Systems  Constitutional: Negative.   HENT: Negative.   Eyes: Negative.   Respiratory: Negative.   Cardiovascular: Negative.   Gastrointestinal: Negative.   Endocrine: Negative.   Genitourinary: Positive for scrotal swelling and testicular pain.  Musculoskeletal: Negative.   Skin: Negative.   Allergic/Immunologic: Negative.   Neurological: Negative.   Hematological: Negative.   Psychiatric/Behavioral: Negative.       Objective:    Physical Exam  Constitutional: He is oriented to person, place, and time. He appears well-developed and well-nourished.  HENT:  Head: Normocephalic.  Eyes: Pupils are equal, round, and reactive to light.  Cardiovascular: Normal rate, regular rhythm, normal heart sounds and intact distal pulses.  Pulmonary/Chest: Effort normal and breath sounds normal.  Abdominal: Soft. Bowel sounds are normal.  Genitourinary:    Genitourinary Comments: Enlarged right scrotum tenderness and pain with palpation unable to completely evaluate for hernia   Musculoskeletal:        General: Normal range of motion.     Cervical back: Normal range of motion.  Neurological: He is alert and oriented to person, place, and time.  Skin: Skin is warm and dry.  Psychiatric: He has a normal mood and affect. His behavior is normal. Judgment and thought content normal.    BP 138/90  (BP Location: Right Arm, Patient Position: Sitting, Cuff Size: Normal) Comment: manually  Pulse 72   Temp 98.1 F (36.7 C) (Oral)   Resp 16   Ht 6\' 3"  (1.905 m)   Wt 216 lb (98 kg)   SpO2 100%   BMI 27.00 kg/m  Wt Readings from Last 3 Encounters:  11/05/19 216 lb (98 kg)  05/20/19 213 lb (96.6 kg)  05/03/19 220 lb (99.8 kg)     Health Maintenance Due  Topic Date Due  . PNEUMOCOCCAL POLYSACCHARIDE VACCINE AGE 70-64 HIGH RISK  02/16/1966  . FOOT  EXAM  02/16/1974  . OPHTHALMOLOGY EXAM  02/16/1974  . URINE MICROALBUMIN  02/16/1974    There are no preventive care reminders to display for this patient.  No results found for: TSH Lab Results  Component Value Date   WBC 13.1 (H) 10/18/2019   HGB 12.3 (L) 10/18/2019   HCT 39.3 10/18/2019   MCV 99.5 10/18/2019   PLT 273 10/18/2019   Lab Results  Component Value Date   NA 131 (L) 10/18/2019   K 3.7 10/18/2019   CO2 22 10/18/2019   GLUCOSE 282 (H) 10/18/2019   BUN 14 10/18/2019   CREATININE 1.18 10/18/2019   BILITOT 0.4 10/18/2019   ALKPHOS 51 10/18/2019   AST 22 10/18/2019   ALT 27 10/18/2019   PROT 7.9 10/18/2019   ALBUMIN 3.4 (L) 10/18/2019   CALCIUM 8.8 (L) 10/18/2019   ANIONGAP 11 10/18/2019   Lab Results  Component Value Date   CHOL 201 (H) 05/07/2016   Lab Results  Component Value Date   HDL 55 05/07/2016   Lab Results  Component Value Date   LDLCALC 122 05/07/2016   Lab Results  Component Value Date   TRIG 131 05/03/2019   Lab Results  Component Value Date   CHOLHDL 3.7 05/07/2016   Lab Results  Component Value Date   HGBA1C 7.7 (A) 11/05/2019      Assessment & Plan:   Problem List Items Addressed This Visit      High   Diabetes mellitus ([place])   Relevant Medications   metFORMIN (GLUCOPHAGE) 500 MG tablet   Other Relevant Orders   HgB A1c (Completed)   Urinalysis Dipstick (Completed)   Lipid Panel   Comprehensive metabolic panel   CBC   Comprehensive Metabolic Panel w/GFR   CBC  with Differential/Platelet   Microalbumin, urine     Unprioritized   RESOLVED: Prediabetes - Primary    Other Visit Diagnoses    Pain in right testicle       Relevant Orders   Ambulatory referral to Urology   Health care maintenance       Relevant Orders   PSA   Swelling of right half of scrotum       Relevant Medications   levofloxacin (LEVAQUIN) 500 MG tablet   Other Relevant Orders   PSA      Meds ordered this encounter  Medications  . metFORMIN (GLUCOPHAGE) 500 MG tablet    Sig: Take 1 tablet (500 mg total) by mouth 2 (two) times daily with a meal.    Dispense:  180 tablet    Refill:  3    Order Specific Question:   Supervising Provider    Answer:   Tresa Garter [id number]  . levofloxacin (LEVAQUIN) 500 MG tablet    Sig: Take 1 tablet (500 mg total) by mouth daily for 10 days.    Dispense:  10 tablet    Refill:  0    Order Specific Question:   Supervising Provider    Answer:   Tresa Garter [id number]    Follow-up: Return in about 3 months (around 02/03/2020).    Vevelyn Francois, NP

## 2019-11-05 NOTE — Patient Instructions (Addendum)
Preventing Cerebrovascular Disease  Arteries are blood vessels that carry blood that contains oxygen from the heart to all parts of the body. Cerebrovascular disease affects arteries that supply the brain. Any condition that blocks or disrupts blood flow to the brain can cause cerebrovascular disease. Brain cells that lose blood supply start to die within minutes (stroke). Stroke is the main danger of cerebrovascular disease. Atherosclerosis and high blood pressure are common causes of cerebrovascular disease. Atherosclerosis is narrowing and hardening of an artery that results when fat, cholesterol, calcium, or other substances (plaque) build up inside an artery. Plaque reduces blood flow through the artery. High blood pressure increases the risk of bleeding inside the brain. Making diet and lifestyle changes to prevent atherosclerosis and high blood pressure lowers your risk of cerebrovascular disease. What nutrition changes can be made?  Eat more fruits, vegetables, and whole grains.  Reduce how much saturated fat you eat. To do this, eat less red meat and fewer full-fat dairy products.  Eat healthy proteins instead of red meat. Healthy proteins include: ? Fish. Eat fish that contains heart-healthy omega-3 fatty acids, twice a week. Examples include salmon, albacore tuna, mackerel, and herring. ? Chicken. ? Nuts. ? Low-fat or nonfat yogurt.  Avoid processed meats, like bacon and lunchmeat.  Avoid foods that contain: ? A lot of sugar, such as sweets and drinks with added sugar. ? A lot of salt (sodium). Avoid adding extra salt to your food, as told by your health care provider. ? Trans fats, such as margarine and baked goods. Trans fats may be listed as "partially hydrogenated oils" on food labels.  Check food labels to see how much sodium, sugar, and trans fats are in foods.  Use vegetable oils that contain low amounts of saturated fat, such as olive oil or canola oil. What lifestyle  changes can be made?  Drink alcohol in moderation. This means no more than 1 drink a day for nonpregnant women and 2 drinks a day for men. One drink equals 12 oz of beer, 5 oz of wine, or 1 oz of hard liquor.  If you are overweight, ask your health care provider to recommend a weight-loss plan for you. Losing 5-10 lb (2.2-4.5 kg) can reduce your risk of diabetes, atherosclerosis, and high blood pressure.  Exercise for 30?60 minutes on most days, or as much as told by your health care provider. ? Do moderate-intensity exercise, such as brisk walking, bicycling, and water aerobics. Ask your health care provider which activities are safe for you.   Do not use any products that contain nicotine or tobacco, such as cigarettes and e-cigarettes. If you need help quitting, ask your health care provider. Why are these changes important? Making these changes lowers your risk of many diseases that can cause cerebrovascular disease and stroke. Stroke is a leading cause of death and disability. Making these changes also improves your overall health and quality of life. What can I do to lower my risk? The following factors make you more likely to develop cerebrovascular disease:  Being overweight.  Smoking.  Being physically inactive.  Eating a high-fat diet.  Having certain health conditions, such as: ? Diabetes. ? High blood pressure. ? Heart disease. ? Atherosclerosis. ? High cholesterol. ? Sickle cell disease.  Talk with your health care provider about your risk for cerebrovascular disease. Work with your health care provider to control diseases that you have that may contribute to cerebrovascular disease. Your health care provider may prescribe medicines to  help prevent major causes of cerebrovascular disease. Where to find more information Learn more about preventing cerebrovascular disease from:  National Heart, Lung, and Blood Institute:  ClassGossip.pl  Centers for Disease Control and Prevention: http://www.gonzalez-chase.net/ Summary  Cerebrovascular disease can lead to a stroke.  Atherosclerosis and high blood pressure are major causes of cerebrovascular disease.  Making diet and lifestyle changes can reduce your risk of cerebrovascular disease.  Work with your health care provider to get your risk factors under control to reduce your risk of cerebrovascular disease. This information is not intended to replace advice given to you by your health care provider. Make sure you discuss any questions you have with your health care provider. Document Revised: 09/20/2017 Document Reviewed: 10/23/2015 Elsevier Patient Education  2020 Else[employer]   Hyperglycemia Hyperglycemia is when the sugar (glucose) level in your blood is too high. It may not cause symptoms. If you do have symptoms, they may include warning signs, such as:  Feeling more thirsty than normal.  Hunger.  Feeling tired.  Needing to pee (urinate) more than normal.  Blurry eyesight (vision). You may get other symptoms as it gets worse, such as:  Dry mouth.  Not being hungry (loss of appetite).  Fruity-smelling breath.  Weakness.  Weight gain or loss that is not planned. Weight loss may be fast.  A tingling or numb feeling in your hands or feet.  Headache.  Skin that does not bounce back quickly when it is lightly pinched and released (poor skin turgor).  Pain in your belly (abdomen).  Cuts or bruises that heal slowly. High blood sugar can happen to people who do or do not have diabetes. High blood sugar can happen slowly or quickly, and it can be an emergency. Follow these instructions at home: General instructions  Take over-the-counter and prescription medicines only as told by your doctor.  Do not use products that contain nicotine or tobacco, such as cigarettes and e-cigarettes. If you need help  quitting, ask your doctor.  Limit alcohol intake to no more than 1 drink per day for nonpregnant women and 2 drinks per day for men. One drink equals 12 oz of beer, 5 oz of wine, or 1 oz of hard liquor.  Manage stress. If you need help with this, ask your doctor.  Keep all follow-up visits as told by your doctor. This is important. Eating and drinking   Stay at a healthy weight.  Exercise regularly, as told by your doctor.  Drink enough fluid, especially when you: ? Exercise. ? Get sick. ? Are in hot temperatures.  Eat healthy foods, such as: ? Low-fat (lean) proteins. ? Complex carbs (complex carbohydrates), such as whole wheat bread or brown rice. ? Fresh fruits and vegetables. ? Low-fat dairy products. ? Healthy fats.  Drink enough fluid to keep your pee (urine) clear or pale yellow. If you have diabetes:   Make sure you know the symptoms of hyperglycemia.  Follow your diabetes management plan, as told by your doctor. Make sure you: ? Take insulin and medicines as told. ? Follow your exercise plan. ? Follow your meal plan. Eat on time. Do not skip meals. ? Check your blood sugar as often as told. Make sure to check before and after exercise. If you exercise longer or in a different way than you normally do, check your blood sugar more often. ? Follow your sick day plan whenever you cannot eat or drink normally. Make this plan ahead of time with your  doctor.  Share your diabetes management plan with people in your workplace, school, and household.  Check your urine for ketones when you are ill and as told by your doctor.  Carry a card or wear jewelry that says that you have diabetes. Contact a doctor if:  Your blood sugar level is higher than 240 mg/dL (83.4 mmol/L) for 2 days in a row.  You have problems keeping your blood sugar in your target range.  High blood sugar happens often for you. Get help right away if:  You have trouble breathing.  You have a  change in how you think, feel, or act (mental status).  You feel sick to your stomach (nauseous), and that feeling does not go away.  You cannot stop throwing up (vomiting). These symptoms may be an emergency. Do not wait to see if the symptoms will go away. Get medical help right away. Call your local emergency services (911 in the [place] Do not drive yourself to the hospital. Summary  Hyperglycemia is when the sugar (glucose) level in your blood is too high.  High blood sugar can happen to people who do or do not have diabetes.  Make sure you drink enough fluids, eat healthy foods, and exercise regularly.  Contact your doctor if you have problems keeping your blood sugar in your target range. This information is not intended to replace advice given to you by your health care provider. Make sure you discuss any questions you have with your health care provider. Document Revised: 06/25/2016 Document Reviewed: 06/25/2016 Elsevier Patient Education  2020 [employer]. .  Epididymitis  Epididymitis is swelling (inflammation) or infection of the epididymis. The epididymis is a cord-like structure that is located along the top and back part of the testicle. It collects and stores sperm from the testicle. This condition can also cause pain and swelling of the testicle and scrotum. Symptoms usually start suddenly (acute epididymitis). Sometimes epididymitis starts gradually and lasts for a while (chronic epididymitis). This type may be harder to treat. What are the causes? In men ages 4-40, this condition is usually caused by a bacterial infection or a sexually transmitted disease (STD), such as:  Gonorrhea.  Chlamydia. In men 41 and older who do not have anal sex, this condition is usually caused by bacteria from a blockage or from abnormalities in the urinary system. These can result from:  Having a tube placed into the bladder (urinary catheter).  Having an enlarged or inflamed  prostate gland.  Having recently had urinary tract surgery.  Having a problem with a backward flow of urine (retrograde). In men who have a condition that weakens the body's defense system (immune system), such as HIV, this condition can be caused by:  Other bacteria, including tuberculosis and syphilis.  Viruses.  Fungi. Sometimes this condition occurs without infection. This may happen because of trauma or repetitive activities such as sports. What increases the risk? You are more likely to develop this condition if you have:  Unprotected sex with more than one partner.  Anal sex.  Recently had surgery.  A urinary catheter.  Urinary problems.  A suppressed immune system. What are the signs or symptoms? This condition usually begins suddenly with chills, fever, and pain behind the scrotum and in the testicle. Other symptoms include:  Swelling of the scrotum, testicle, or both.  Pain when ejaculating or urinating.  Pain in the back or abdomen.  Nausea.  Itching and discharge from the penis.  A frequent need  to pass urine.  Redness, increased warmth, and tenderness of the scrotum. How is this diagnosed? Your health care provider can diagnose this condition based on your symptoms and medical history. Your health care provider will also do a physical exam to ask about your symptoms and check your scrotum and testicle for swelling, pain, and redness. You may also have other tests, including:  Examination of discharge from the penis.  Urine tests for infections, such as STDs.  Ultrasound test for blood flow and inflammation. Your health care provider may test you for other STDs, including HIV. How is this treated? Treatment for this condition depends on the cause. If your condition is caused by a bacterial infection, oral antibiotic medicine may be prescribed. If the bacterial infection has spread to your blood, you may need to receive IV antibiotics. For both  bacterial and nonbacterial epididymitis, you may be treated with:  Rest.  Elevation of the scrotum.  Pain medicines.  Anti-inflammatory medicines. Surgery may be needed to treat:  Bacterial epididymitis that causes pus to build up in the scrotum (abscess).  Chronic epididymitis that has not responded to other treatments. Follow these instructions at home: Medicines  Take over-the-counter and prescription medicines only as told by your health care provider.  If you were prescribed an antibiotic medicine, take it as told by your health care provider. Do not stop taking the antibiotic even if your condition improves. Sexual activity  If your epididymitis was caused by an STD, avoid sexual activity until your treatment is complete.  Inform your sexual partner or partners if you test positive for an STD. They may need to be treated. Do not engage in sexual activity with your partner or partners until their treatment is completed. Managing pain and swelling   If directed, elevate your scrotum and apply ice. ? Put ice in a plastic bag. ? Place a small towel or pillow between your legs. ? Rest your scrotum on the pillow or towel. ? Place another towel between your skin and the plastic bag. ? Leave the ice on for 20 minutes, 2-3 times a day.  Try taking a sitz bath to help with discomfort. This is a warm water bath that is taken while you are sitting down. The water should only come up to your hips and should cover your buttocks. Do this 3-4 times per day or as told by your health care provider.  Keep your scrotum elevated and supported while resting. Ask your health care provider if you should wear a scrotal support, such as a jockstrap. Wear it as told by your health care provider. General instructions  Return to your normal activities as told by your health care provider. Ask your health care provider what activities are safe for you.  Drink enough fluid to keep your urine pale  yellow.  Keep all follow-up visits as told by your health care provider. This is important. Contact a health care provider if:  You have a fever.  Your pain medicine is not helping.  Your pain is getting worse.  Your symptoms do not improve within 3 days. Summary  Epididymitis is swelling (inflammation) or infection of the epididymis. This condition can also cause pain and swelling of the testicle and scrotum.  Treatment for this condition depends on the cause. If your condition is caused by a bacterial infection, oral antibiotic medicine may be prescribed.  Inform your sexual partner or partners if you test positive for an STD. They may need to be  treated. Do not engage in sexual activity with your partner or partners until their treatment is completed.  Contact a health care provider if your symptoms do not improve within 3 days. This information is not intended to replace advice given to you by your health care provider. Make sure you discuss any questions you have with your health care provider. Document Revised: 08/11/2018 Document Reviewed: 08/12/2018 Elsevier Patient Education  2020 [employer].

## 2019-11-06 LAB — CBC WITH DIFFERENTIAL/PLATELET
Basophils Absolute: 0 10*3/uL (ref 0.0–0.2)
Basos: 1 %
EOS (ABSOLUTE): 0.1 10*3/uL (ref 0.0–0.4)
Eos: 2 %
Hematocrit: 38.3 % (ref 37.5–51.0)
Hemoglobin: 13 g/dL (ref 13.0–17.7)
Immature Grans (Abs): 0 10*3/uL (ref 0.0–0.1)
Immature Granulocytes: 0 %
Lymphocytes Absolute: 2 10*3/uL (ref 0.7–3.1)
Lymphs: 37 %
MCH: 31.4 pg (ref 26.6–33.0)
MCHC: 33.9 g/dL (ref 31.5–35.7)
MCV: 93 fL (ref 79–97)
Monocytes Absolute: 0.6 10*3/uL (ref 0.1–0.9)
Monocytes: 10 %
Neutrophils Absolute: 2.7 10*3/uL (ref 1.4–7.0)
Neutrophils: 50 %
Platelets: 220 10*3/uL (ref 150–450)
RBC: 4.14 x10E6/uL (ref 4.14–5.80)
RDW: 12.4 % (ref 11.6–15.4)
WBC: 5.4 10*3/uL (ref 3.4–10.8)

## 2019-11-06 LAB — COMP. METABOLIC PANEL (12)
AST: 25 IU/L (ref 0–40)
Albumin/Globulin Ratio: 1.3 (ref 1.2–2.2)
Albumin: 4.4 g/dL (ref 3.8–4.9)
Alkaline Phosphatase: 62 IU/L (ref 39–117)
BUN/Creatinine Ratio: 11 (ref 9–20)
BUN: 10 mg/dL (ref 6–24)
Bilirubin Total: 0.4 mg/dL (ref 0.0–1.2)
Calcium: 9.6 mg/dL (ref 8.7–10.2)
Chloride: 100 mmol/L (ref 96–106)
Creatinine, Ser: 0.91 mg/dL (ref 0.76–1.27)
GFR calc Af Amer: 109 mL/min/{1.73_m2} (ref >59)
GFR calc non Af Amer: 95 mL/min/{1.73_m2} (ref >59)
Globulin, Total: 3.4 g/dL (ref 1.5–4.5)
Glucose: 153 mg/dL — ABNORMAL HIGH (ref 65–99)
Potassium: 4.5 mmol/L (ref 3.5–5.2)
Sodium: 136 mmol/L (ref 134–144)
Total Protein: 7.8 g/dL (ref 6.0–8.5)

## 2019-11-06 LAB — LIPID PANEL
Chol/HDL Ratio: 4.1 ratio (ref 0.0–5.0)
Cholesterol, Total: 217 mg/dL — ABNORMAL HIGH (ref 100–199)
HDL: 53 mg/dL (ref >39)
LDL Chol Calc (NIH): 137 mg/dL — ABNORMAL HIGH (ref 0–99)
Triglycerides: 152 mg/dL — ABNORMAL HIGH (ref 0–149)
VLDL Cholesterol Cal: 27 mg/dL (ref 5–40)

## 2019-11-06 LAB — MICROALBUMIN, URINE: Microalbumin, Urine: 4 ug/mL

## 2019-11-07 LAB — SPECIMEN STATUS REPORT

## 2019-11-07 LAB — PSA: Prostate Specific Ag, Serum: 1.7 ng/mL (ref 0.0–4.0)

## 2019-12-02 ENCOUNTER — Ambulatory Visit: Payer: Self-pay | Admitting: Urology

## 2019-12-02 ENCOUNTER — Encounter: Payer: Self-pay | Admitting: Urology

## 2020-02-03 ENCOUNTER — Ambulatory Visit: Payer: Self-pay | Admitting: Nurse Practitioner

## 2020-09-09 IMAGING — US US SCROTUM
1 series · 13 of 25 positions shown · non-contrast
Comparison: None.

CLINICAL DATA: Right testicular pain and swelling.

EXAM:
SCROTAL ULTRASOUND
DOPPLER ULTRASOUND OF THE TESTICLES
TECHNIQUE: Complete ultrasound examination of the testicles, epididymis, and
other scrotal structures was performed. Color and spectral Doppler
ultrasound were also utilized to evaluate blood flow to the
testicles.

[Series 1: us scrotum · 13 of 46 slices shown]
[im 1/46]
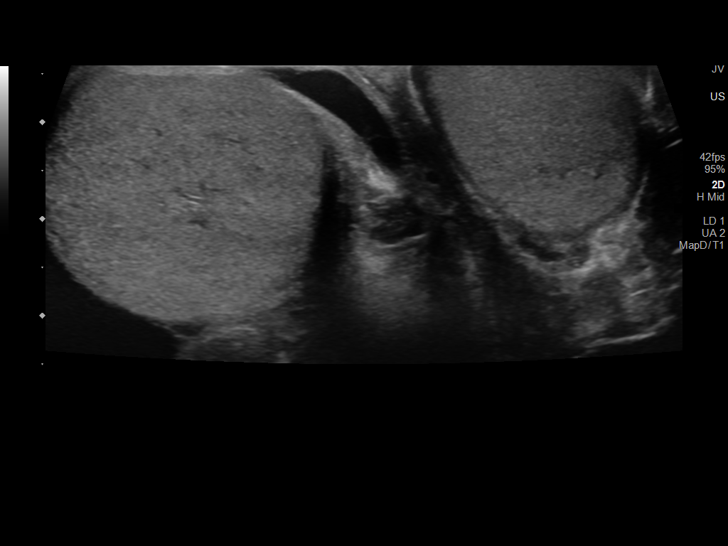
[im 4/46]
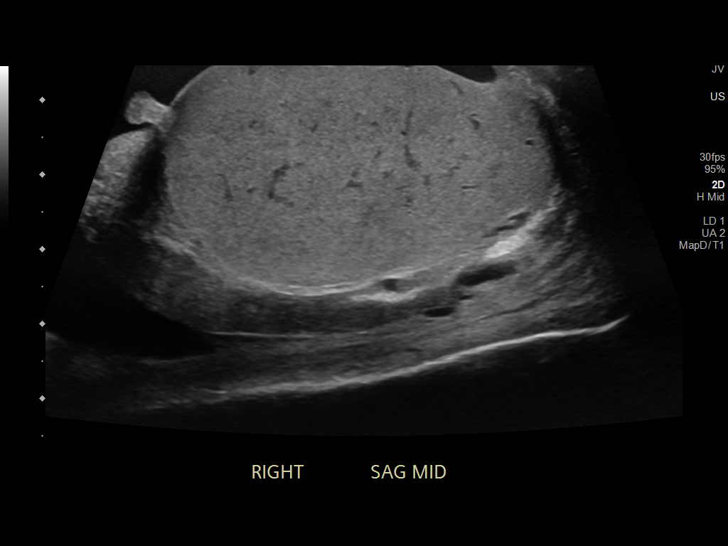
[im 8/46]
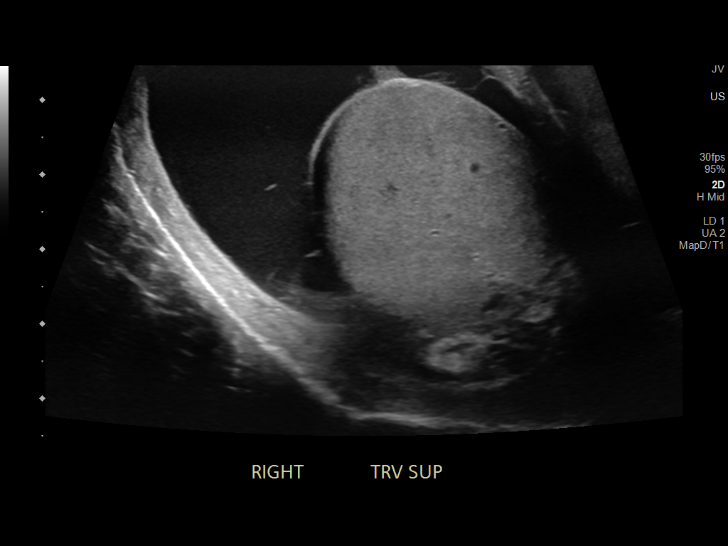
[im 12/46]
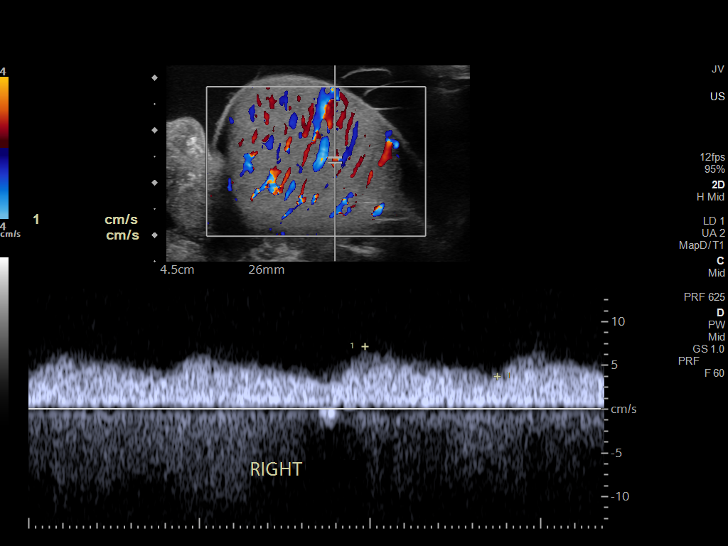
[im 16/46]
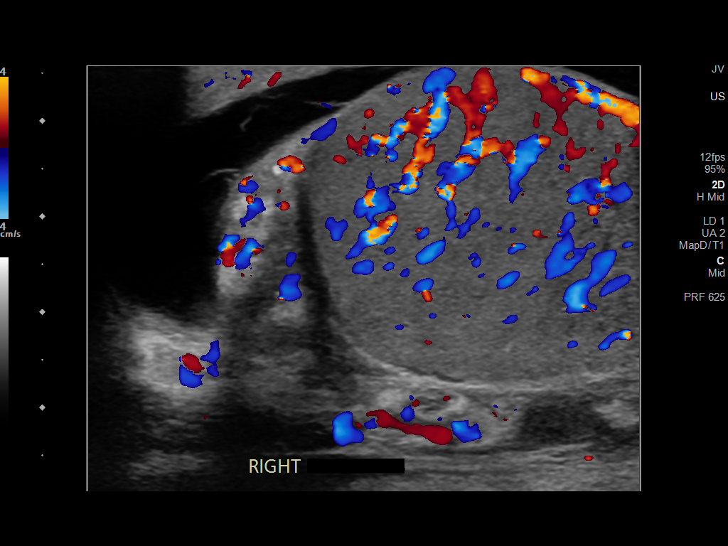
[im 19/46]
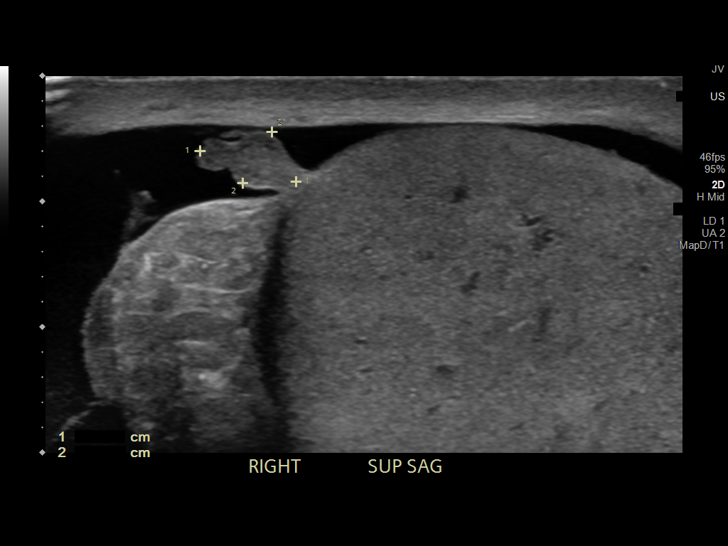
[im 23/46]
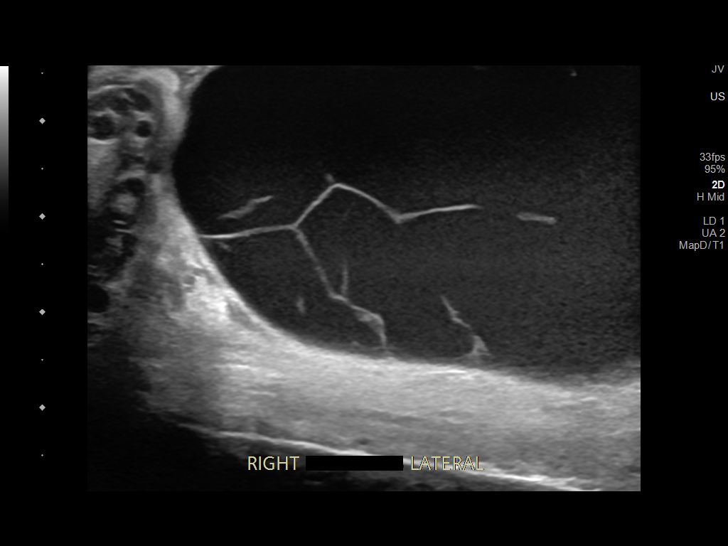
[im 27/46]
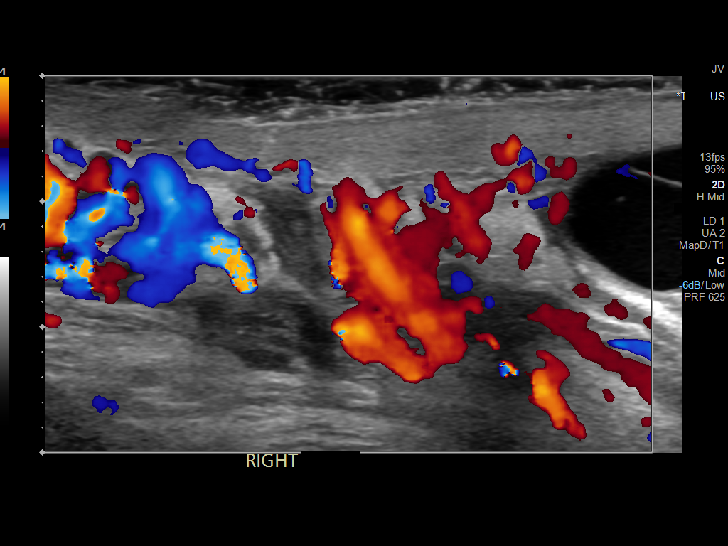
[im 31/46]
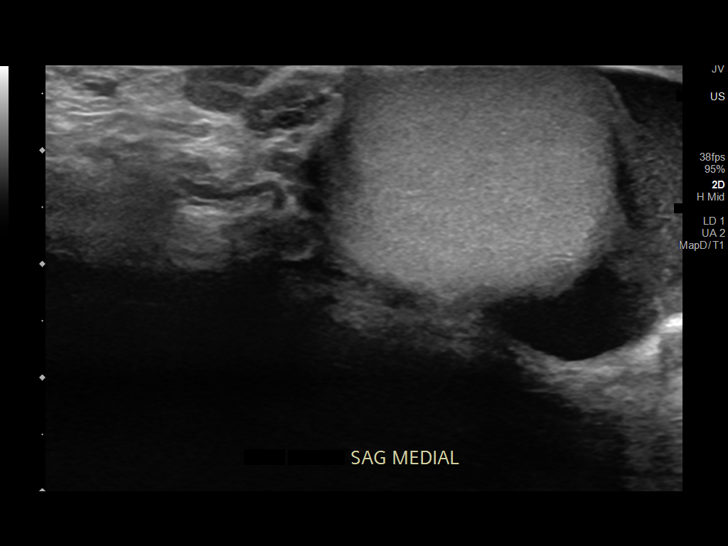
[im 34/46]
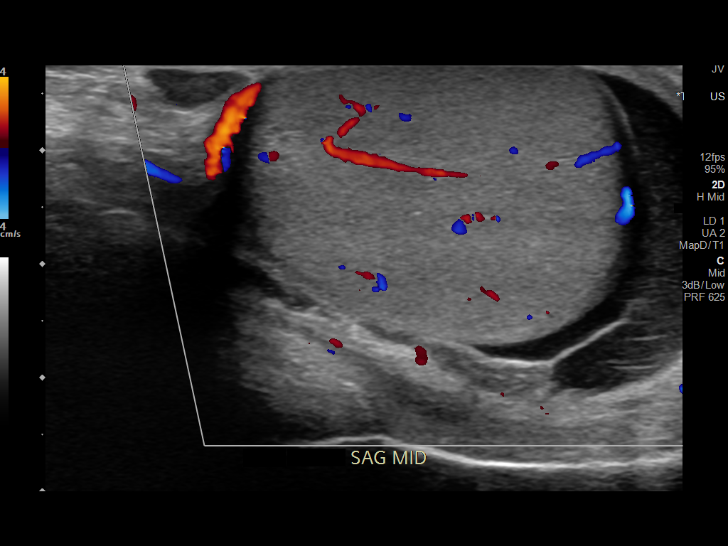
[im 38/46]
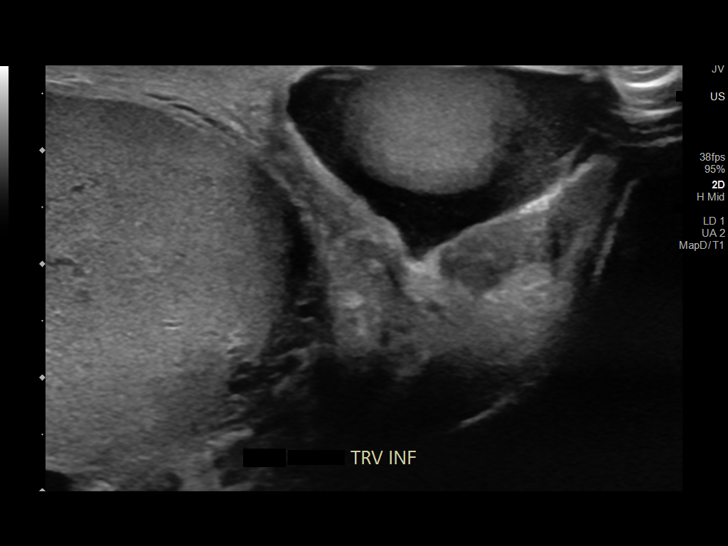
[im 42/46]
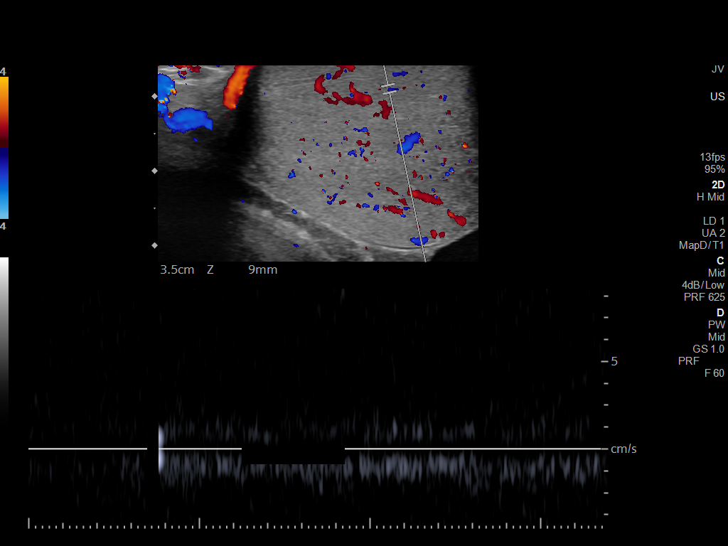
[im 46/46]
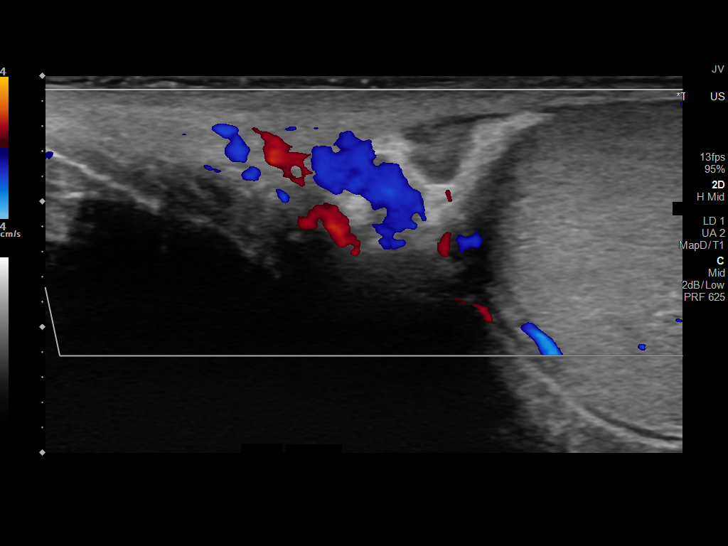

[13 of 25 positions shown; findings below may reference images not displayed]

FINDINGS: Right testicle

Measurements: 5.5 x 3.3 x 3.5 cm. No mass or microlithiasis
visualized. Testicular and epididymal tissue appears hypervascular
on color Doppler evaluation.

Left testicle

Measurements: 3.8 x 2.4 x 3.3 cm. No mass or microlithiasis
visualized.

Right epididymis: Heterogeneous echotexture with increased color
signal on Doppler assessment.

Left epididymis:  Normal in size and appearance.

Hydrocele: Complex right greater than left fluid collections noted
in each hemiscrotum, containing internal septation.

Varicocele:  None visualized.

Pulsed Doppler interrogation of both testes demonstrates normal low
resistance arterial and venous waveforms bilaterally.
IMPRESSION: 1. Sonographic findings most consistent with right
epididymo-orchitis. Complex fluid identified in the right
hemiscrotum.
2. No evidence for testicular torsion.
3. Small volume complex fluid left hemiscrotum, indeterminate.

## 2022-03-15 ENCOUNTER — Other Ambulatory Visit: Payer: Self-pay

## 2022-03-15 ENCOUNTER — Emergency Department (HOSPITAL_COMMUNITY): Payer: Self-pay

## 2022-03-15 ENCOUNTER — Encounter (HOSPITAL_COMMUNITY): Payer: Self-pay

## 2022-03-15 ENCOUNTER — Emergency Department (HOSPITAL_COMMUNITY)
Admission: EM | Admit: 2022-03-15 | Discharge: 2022-03-15 | Disposition: A | Discharge status: Home / Self Care | Payer: Self-pay | Attending: Emergency Medicine | Admitting: Emergency Medicine

## 2022-03-15 DIAGNOSIS — M25511 Pain in right shoulder: Secondary | ICD-10-CM | POA: Insufficient documentation

## 2022-03-15 MED ORDER — NAPROXEN 500 MG PO TABS: 500.0000 mg | ORAL_TABLET | Freq: Two times a day (BID) | ORAL | 0 refills | Status: AC

## 2022-03-15 MED ORDER — NAPROXEN 500 MG PO TABS: 500.0000 mg | ORAL_TABLET | Freq: Two times a day (BID) | ORAL | 0 refills | Status: DC

## 2022-03-15 NOTE — ED Triage Notes (Addendum)
Pt arrived POV from home c/o right shoulder pain x2-3 weeks but now is worse. Pt states he cannot lay on it or lift it. Pt unsure if he injured it

## 2022-03-15 NOTE — ED Provider Notes (Signed)
[facility] EMERGENCY DEPARTMENT Provider Note   CSN: [id number] Arrival date & time: 03/15/22  1138     History  Chief Complaint  Patient presents with   Shoulder Pain    Carl Barnett is a 56 y.o. male.  56 y.o male with no PMH presents to the ED with a chief complaint of right shoulder pain has been ongoing for the past 3 weeks.  Patient continues to endorse an aching sharp pain to the shoulder especially with lifting, movement, any type of range of motion.  He attempted to pick up a gallon of water yesterday from the floor and felt that his shoulder sort of gave out.  He has previously gone evaluated and told he had a torn rotator cuff, he has not followed up with anyone for this intervention.  He has not taken any medication for improvement of her symptoms.  He does endorse taking Seymour's, this is a natural vitamin.  States last night he was unable to sleep because he felt like he could not even lay on it.  He denies any trauma, fever, prior history of invention to the right shoulder.  The history is provided by the patient.  Shoulder Pain Associated symptoms: no fever       Home Medications Prior to Admission medications   Medication Sig Start Date End Date Taking? Authorizing Provider  naproxen (NAPROSYN) 500 MG tablet Take 1 tablet (500 mg total) by mouth 2 (two) times daily for 7 days. 03/15/22 03/22/22 Yes Deryl Giroux, Leonie Douglas, PA-C  Albuterol Sulfate 108 (90 Base) MCG/ACT AEPB Inhale 2 puffs into the lungs every 4 (four) hours as needed (for shortness of breath or wheezing).    [provider]  ibuprofen (ADVIL) 200 MG tablet Take 200 mg by mouth every 6 (six) hours as needed.    [provider]  metFORMIN (GLUCOPHAGE) 500 MG tablet Take 1 tablet (500 mg total) by mouth 2 (two) times daily with a meal. 11/05/19   Barbette Merino, NP  vitamin C (VITAMIN C) 500 MG tablet Take 1 tablet (500 mg total) by mouth daily. Patient not taking: Reported on  11/05/2019 05/05/19   Almon Hercules, MD  zinc sulfate 220 (50 Zn) MG capsule Take 1 capsule (220 mg total) by mouth daily. Patient not taking: Reported on 11/05/2019 05/05/19   Almon Hercules, MD      Allergies    Patient has no known allergies.    Review of Systems   Review of Systems  Constitutional:  Negative for fever.  Musculoskeletal:  Positive for arthralgias.   Physical Exam Updated Vital Signs BP (!) 152/104 (BP Location: Right Arm)   Pulse 74   Temp 98.1 F (36.7 C) (Oral)   Resp 14   Ht 6\' 3"  (1.905 m)   Wt 98 kg   SpO2 97%   BMI 27.00 kg/m  Physical Exam Vitals and nursing note reviewed.  Constitutional:      Appearance: Normal appearance.  HENT:     Head: Normocephalic and atraumatic.  Eyes:     Pupils: Pupils are equal, round, and reactive to light.  Cardiovascular:     Rate and Rhythm: Normal rate.  Pulmonary:     Effort: Pulmonary effort is normal.  Abdominal:     General: Abdomen is flat.  Musculoskeletal:     Right shoulder: Tenderness and bony tenderness present. Decreased range of motion.     Cervical back: Normal range of motion and neck supple.  Comments: Patient states he is unable to flex, extend the right shoulder.  Attempted to do grip strength, however patient states that he is unable to grip with his right hand.  Pulses present, right hand appears warm to touch with no swelling and good capillary refill.  Skin:    General: Skin is warm and dry.  Neurological:     Mental Status: He is alert and oriented to person, place, and time.    ED Results / Procedures / Treatments   Labs (all labs ordered are listed, but only abnormal results are displayed) Labs Reviewed - No data to display  EKG None  Radiology DG Shoulder Right  Result Date: 03/15/2022 CLINICAL DATA:  Right shoulder pain for 3 weeks. EXAM: RIGHT SHOULDER - 2+ VIEW COMPARISON:  None Available. FINDINGS: No acute fracture or dislocation is identified. Glenohumeral joint space  width is preserved. A 4 mm calcification is noted in the soft tissues adjacent to the greater tuberosity. IMPRESSION: 1. No acute osseous abnormality. 2. Possible rotator cuff calcific tendinitis. Electronically Signed   By: Sebastian Ache M.D.   On: 03/15/2022 12:12    Procedures Procedures    Medications Ordered in ED Medications - No data to display  ED Course/ Medical Decision Making/ A&P                           Medical Decision Making Amount and/or Complexity of Data Reviewed Radiology: ordered.  Risk Prescription drug management.   Patient presents to the ED with a chief complaint of right shoulder pain for the past 3 weeks, no prior intervention of the right shoulder, no trauma.  Reports he notices that this is continuously weak, worsen whenever overhead movement. No fever.   During evaluation patient is neurovascularly intact.  No signs of trauma, there is pain with palpation along the humeral head, right along the [facility] joint.  He does endorse a prior history of rotator cuff being torn however no orthopedist follow-up.  He did not take any medications for improvement in symptoms.  X-ray on today's visit does not show any acute fracture, some rotator cuff tendinitis noted, I discussed this at length with patient.  He will need to follow-up outpatient, sent home on a short course of anti-inflammatories, he denies any kidney complaints in the past.  Patient hemodynamically stable for discharge.   Portions of this note were generated with Scientist, clinical (histocompatibility and immunogenetics). Dictation errors may occur despite best attempts at proofreading.   Final Clinical Impression(s) / ED Diagnoses Final diagnoses:  Acute pain of right shoulder    Rx / DC Orders ED Discharge Orders          Ordered    naproxen (NAPROSYN) 500 MG tablet  2 times daily        03/15/22 1332              Claude Manges, PA-C 03/15/22 1336    Long, Arlyss Repress, MD 03/18/22 1308

## 2022-03-15 NOTE — Discharge Instructions (Signed)
You were given a sling today to place to your right shoulder for comfort, this will only need to be in place for the next 12 days.  The phone number to Dr. Mardelle Matte was attached to your chart, you will need to schedule an appointment for further follow-up of your right shoulder pain.  I prescribed a short course of anti-inflammatories to help with pain, please take 1 tablet twice a day with food for the next 7 days.

## 2022-11-14 ENCOUNTER — Encounter (HOSPITAL_BASED_OUTPATIENT_CLINIC_OR_DEPARTMENT_OTHER): Payer: Self-pay | Admitting: Specialist

## 2022-11-14 NOTE — Progress Notes (Signed)
Spoke w/ via phone for pre-op interview--- Carl Barnett Lab needs dos---- CBG              Lab results------ COVID test -----patient states asymptomatic no test needed Arrive at -------1045 NPO after MN NO Solid Food.  Clear liquids from MN until---0945 Med rec completed Medications to take morning of surgery -----Albuterol PRN Diabetic medication ----- NONE AM of surgery Patient instructed no nail polish to be worn day of surgery Patient instructed to bring photo id and insurance card day of surgery Patient aware to have Driver (ride ) / caregiver Carl Barnett brother    for 24 hours after surgery  Patient Special Instructions ----- Pre-Op special Istructions ----- Patient verbalized understanding of instructions that were given at this phone interview. Patient denies shortness of breath, chest pain, fever, cough at this phone interview.

## 2022-11-21 NOTE — Anesthesia Preprocedure Evaluation (Addendum)
Anesthesia Evaluation  Patient identified by MRN, date of birth, ID band Patient awake    Reviewed: Allergy & Precautions, NPO status , Patient's Chart, lab work & pertinent test results  Airway Mallampati: II  TM Distance: >3 FB Neck ROM: Full    Dental no notable dental hx. (+) Loose, Poor Dentition, Dental Advisory Given,    Pulmonary neg pulmonary ROS   Pulmonary exam normal breath sounds clear to auscultation       Cardiovascular Exercise Tolerance: Good Normal cardiovascular exam Rhythm:Regular Rate:Normal     Neuro/Psych  Neuromuscular disease  negative psych ROS   GI/Hepatic negative GI ROS, Neg liver ROS,,,  Endo/Other  diabetes, Poorly Controlled    Renal/GU negative Renal ROSLab Results      Component                Value               Date                      CREATININE               0.87                11/22/2022                BUN                      14                  11/22/2022                NA                       133 (L)             11/22/2022                K                        3.8                 11/22/2022                CL                       101                 11/22/2022                CO2                      22                  11/22/2022                Musculoskeletal  (+) Arthritis ,    Abdominal   Peds  Hematology   Anesthesia Other Findings NKDA  Reproductive/Obstetrics                             Anesthesia Physical Anesthesia Plan  ASA: 2  Anesthesia Plan: General and Regional   Post-op Pain Management: Regional block* and Minimal or no pain anticipated   Induction:   PONV Risk Score and Plan: Treatment may vary due to  age or medical condition, Midazolam, Ondansetron and Dexamethasone  Airway Management Planned: Oral ETT  Additional Equipment: None  Intra-op Plan:   Post-operative Plan: Extubation in OR  Informed Consent: I have  reviewed the patients History and Physical, chart, labs and discussed the procedure including the risks, benefits and alternatives for the proposed anesthesia with the patient or authorized representative who has indicated his/her understanding and acceptance.     Dental advisory given  Plan Discussed with:   Anesthesia Plan Comments: (R ISB w GA)       Anesthesia Quick Evaluation

## 2022-11-22 ENCOUNTER — Ambulatory Visit (HOSPITAL_BASED_OUTPATIENT_CLINIC_OR_DEPARTMENT_OTHER): Payer: 59 | Admitting: Anesthesiology

## 2022-11-22 ENCOUNTER — Other Ambulatory Visit: Payer: Self-pay

## 2022-11-22 ENCOUNTER — Encounter (HOSPITAL_BASED_OUTPATIENT_CLINIC_OR_DEPARTMENT_OTHER): Payer: Self-pay | Admitting: Specialist

## 2022-11-22 ENCOUNTER — Encounter (HOSPITAL_BASED_OUTPATIENT_CLINIC_OR_DEPARTMENT_OTHER)
Admission: RE | Disposition: A | Discharge status: Home / Self Care | Payer: Self-pay | Source: Home / Self Care | Attending: Specialist

## 2022-11-22 ENCOUNTER — Ambulatory Visit (HOSPITAL_BASED_OUTPATIENT_CLINIC_OR_DEPARTMENT_OTHER)
Admission: RE | Admit: 2022-11-22 | Discharge: 2022-11-22 | Disposition: A | Discharge status: Home / Self Care | Payer: 59 | Attending: Specialist | Admitting: Specialist

## 2022-11-22 DIAGNOSIS — M19011 Primary osteoarthritis, right shoulder: Secondary | ICD-10-CM | POA: Insufficient documentation

## 2022-11-22 DIAGNOSIS — S43431A Superior glenoid labrum lesion of right shoulder, initial encounter: Secondary | ICD-10-CM

## 2022-11-22 DIAGNOSIS — M75111 Incomplete rotator cuff tear or rupture of right shoulder, not specified as traumatic: Secondary | ICD-10-CM | POA: Insufficient documentation

## 2022-11-22 DIAGNOSIS — M7541 Impingement syndrome of right shoulder: Secondary | ICD-10-CM | POA: Diagnosis not present

## 2022-11-22 DIAGNOSIS — X58XXXA Exposure to other specified factors, initial encounter: Secondary | ICD-10-CM | POA: Diagnosis not present

## 2022-11-22 DIAGNOSIS — M7551 Bursitis of right shoulder: Secondary | ICD-10-CM | POA: Diagnosis not present

## 2022-11-22 DIAGNOSIS — Z9889 Other specified postprocedural states: Secondary | ICD-10-CM

## 2022-11-22 DIAGNOSIS — E119 Type 2 diabetes mellitus without complications: Secondary | ICD-10-CM

## 2022-11-22 HISTORY — PX: SHOULDER ARTHROSCOPY WITH DISTAL CLAVICLE RESECTION: SHX5675

## 2022-11-22 HISTORY — DX: Type 2 diabetes mellitus without complications: E11.9

## 2022-11-22 HISTORY — DX: Unspecified injury of shoulder and upper arm, unspecified arm, initial encounter: S49.90XA

## 2022-11-22 HISTORY — PX: SHOULDER ARTHROSCOPY WITH SUBACROMIAL DECOMPRESSION: SHX5684

## 2022-11-22 HISTORY — DX: Bronchitis, not specified as acute or chronic: J40

## 2022-11-22 LAB — BASIC METABOLIC PANEL
Anion gap: 10 (ref 5–15)
BUN: 14 mg/dL (ref 6–20)
CO2: 22 mmol/L (ref 22–32)
Calcium: 8.9 mg/dL (ref 8.9–10.3)
Chloride: 101 mmol/L (ref 98–111)
Creatinine, Ser: 0.87 mg/dL (ref 0.61–1.24)
GFR, Estimated: >60 mL/min (ref >60)
Glucose, Bld: 356 mg/dL — ABNORMAL HIGH (ref 70–99)
Potassium: 3.8 mmol/L (ref 3.5–5.1)
Sodium: 133 mmol/L — ABNORMAL LOW (ref 135–145)

## 2022-11-22 LAB — GLUCOSE, CAPILLARY
Glucose-Capillary: 350 mg/dL — ABNORMAL HIGH (ref 70–99)
Glucose-Capillary: 284 mg/dL — ABNORMAL HIGH (ref 70–99)
Glucose-Capillary: 269 mg/dL — ABNORMAL HIGH (ref 70–99)

## 2022-11-22 SURGERY — SHOULDER ARTHROSCOPY WITH SUBACROMIAL DECOMPRESSION
Anesthesia: Regional | Site: Shoulder | Laterality: Right

## 2022-11-22 MED ORDER — ROCURONIUM BROMIDE 10 MG/ML (PF) SYRINGE
PREFILLED_SYRINGE | INTRAVENOUS | Status: AC
  Filled 2022-11-22: qty 10

## 2022-11-22 MED ORDER — ONDANSETRON HCL 4 MG/2ML IJ SOLN
INTRAMUSCULAR | Status: AC
  Filled 2022-11-22: qty 2

## 2022-11-22 MED ORDER — FENTANYL CITRATE (PF) 100 MCG/2ML IJ SOLN
100.0000 ug | Freq: Once | INTRAMUSCULAR | Status: AC
  Administered 2022-11-22: 100 ug via INTRAVENOUS

## 2022-11-22 MED ORDER — INSULIN ASPART 100 UNIT/ML IJ SOLN
INTRAMUSCULAR | Status: AC
  Filled 2022-11-22: qty 1

## 2022-11-22 MED ORDER — AMISULPRIDE (ANTIEMETIC) 5 MG/2ML IV SOLN: 10.0000 mg | Freq: Once | INTRAVENOUS | Status: DC | PRN

## 2022-11-22 MED ORDER — FENTANYL CITRATE (PF) 100 MCG/2ML IJ SOLN
INTRAMUSCULAR | Status: AC
  Filled 2022-11-22: qty 2

## 2022-11-22 MED ORDER — SUGAMMADEX SODIUM 200 MG/2ML IV SOLN
INTRAVENOUS | Status: DC | PRN
  Administered 2022-11-22: 200 mg via INTRAVENOUS

## 2022-11-22 MED ORDER — INSULIN ASPART 100 UNIT/ML IJ SOLN
6.0000 [IU] | Freq: Once | INTRAMUSCULAR | Status: AC
  Administered 2022-11-22: 6 [IU] via SUBCUTANEOUS

## 2022-11-22 MED ORDER — MIDAZOLAM HCL 2 MG/2ML IJ SOLN
INTRAMUSCULAR | Status: AC
  Filled 2022-11-22: qty 2

## 2022-11-22 MED ORDER — ROCURONIUM BROMIDE 10 MG/ML (PF) SYRINGE
PREFILLED_SYRINGE | INTRAVENOUS | Status: DC | PRN
  Administered 2022-11-22: 50 mg via INTRAVENOUS

## 2022-11-22 MED ORDER — DEXMEDETOMIDINE HCL IN NACL 200 MCG/50ML IV SOLN
INTRAVENOUS | Status: DC | PRN
  Administered 2022-11-22: 12 ug via INTRAVENOUS

## 2022-11-22 MED ORDER — CEFAZOLIN SODIUM-DEXTROSE 2-4 GM/100ML-% IV SOLN
INTRAVENOUS | Status: AC
  Filled 2022-11-22: qty 100

## 2022-11-22 MED ORDER — ONDANSETRON HCL 4 MG PO TABS: 4.0000 mg | ORAL_TABLET | Freq: Every day | ORAL | 1 refills | Status: AC | PRN

## 2022-11-22 MED ORDER — BUPIVACAINE HCL (PF) 0.5 % IJ SOLN
INTRAMUSCULAR | Status: DC | PRN
  Administered 2022-11-22: 15 mL via PERINEURAL

## 2022-11-22 MED ORDER — PROPOFOL 10 MG/ML IV BOLUS
INTRAVENOUS | Status: AC
  Filled 2022-11-22: qty 20

## 2022-11-22 MED ORDER — DEXAMETHASONE SODIUM PHOSPHATE 10 MG/ML IJ SOLN
INTRAMUSCULAR | Status: AC
  Filled 2022-11-22: qty 1

## 2022-11-22 MED ORDER — BUPIVACAINE HCL (PF) 0.25 % IJ SOLN: INTRAMUSCULAR | Status: DC | PRN

## 2022-11-22 MED ORDER — CEFAZOLIN SODIUM-DEXTROSE 2-4 GM/100ML-% IV SOLN
2.0000 g | INTRAVENOUS | Status: AC
  Administered 2022-11-22: 2 g via INTRAVENOUS

## 2022-11-22 MED ORDER — FENTANYL CITRATE (PF) 100 MCG/2ML IJ SOLN
INTRAMUSCULAR | Status: DC | PRN
  Administered 2022-11-22 (×2): 50 ug via INTRAVENOUS

## 2022-11-22 MED ORDER — BUPIVACAINE LIPOSOME 1.3 % IJ SUSP
INTRAMUSCULAR | Status: DC | PRN
  Administered 2022-11-22: 10 mL via PERINEURAL

## 2022-11-22 MED ORDER — LIDOCAINE 2% (20 MG/ML) 5 ML SYRINGE
INTRAMUSCULAR | Status: DC | PRN
  Administered 2022-11-22: 100 mg via INTRAVENOUS

## 2022-11-22 MED ORDER — EPINEPHRINE PF 1 MG/ML IJ SOLN
INTRAMUSCULAR | Status: DC | PRN
  Administered 2022-11-22: .04 mg

## 2022-11-22 MED ORDER — INSULIN ASPART 100 UNIT/ML IJ SOLN
4.0000 [IU] | Freq: Once | INTRAMUSCULAR | Status: AC
  Administered 2022-11-22: 4 [IU] via SUBCUTANEOUS

## 2022-11-22 MED ORDER — LIDOCAINE HCL (PF) 2 % IJ SOLN
INTRAMUSCULAR | Status: AC
  Filled 2022-11-22: qty 5

## 2022-11-22 MED ORDER — HYDRALAZINE HCL 20 MG/ML IJ SOLN
INTRAMUSCULAR | Status: AC
  Filled 2022-11-22: qty 1

## 2022-11-22 MED ORDER — ONDANSETRON HCL 4 MG/2ML IJ SOLN
INTRAMUSCULAR | Status: DC | PRN
  Administered 2022-11-22: 4 mg via INTRAVENOUS

## 2022-11-22 MED ORDER — DEXMEDETOMIDINE HCL IN NACL 80 MCG/20ML IV SOLN
INTRAVENOUS | Status: AC
  Filled 2022-11-22: qty 20

## 2022-11-22 MED ORDER — OXYCODONE HCL 5 MG PO TABS: 5.0000 mg | ORAL_TABLET | Freq: Once | ORAL | Status: DC | PRN

## 2022-11-22 MED ORDER — LACTATED RINGERS IV SOLN: INTRAVENOUS | Status: DC

## 2022-11-22 MED ORDER — ACETAMINOPHEN 10 MG/ML IV SOLN: 1000.0000 mg | Freq: Once | INTRAVENOUS | Status: DC | PRN

## 2022-11-22 MED ORDER — CEPHALEXIN 500 MG PO CAPS: 500.0000 mg | ORAL_CAPSULE | Freq: Four times a day (QID) | ORAL | 0 refills | Status: AC

## 2022-11-22 MED ORDER — HYDRALAZINE HCL 20 MG/ML IJ SOLN
INTRAMUSCULAR | Status: DC | PRN
  Administered 2022-11-22: 5 mg via INTRAVENOUS

## 2022-11-22 MED ORDER — OXYCODONE HCL 5 MG/5ML PO SOLN: 5.0000 mg | Freq: Once | ORAL | Status: DC | PRN

## 2022-11-22 MED ORDER — SODIUM CHLORIDE 0.9 % IR SOLN
Status: DC | PRN
  Administered 2022-11-22: 6000 mL
  Administered 2022-11-22: 3000 mL

## 2022-11-22 MED ORDER — METHOCARBAMOL 500 MG PO TABS: 500.0000 mg | ORAL_TABLET | Freq: Four times a day (QID) | ORAL | 0 refills | Status: DC

## 2022-11-22 MED ORDER — MIDAZOLAM HCL 2 MG/2ML IJ SOLN
2.0000 mg | Freq: Once | INTRAMUSCULAR | Status: AC
  Administered 2022-11-22: 2 mg via INTRAVENOUS

## 2022-11-22 MED ORDER — HYDROMORPHONE HCL 1 MG/ML IJ SOLN: 0.2500 mg | INTRAMUSCULAR | Status: DC | PRN

## 2022-11-22 MED ORDER — ONDANSETRON HCL 4 MG/2ML IJ SOLN: 4.0000 mg | Freq: Once | INTRAMUSCULAR | Status: DC | PRN

## 2022-11-22 MED ORDER — SODIUM CHLORIDE (PF) 0.9 % IJ SOLN
INTRAMUSCULAR | Status: DC | PRN
  Administered 2022-11-22: 8 mL

## 2022-11-22 MED ORDER — DIPHENHYDRAMINE HCL 50 MG/ML IJ SOLN
INTRAMUSCULAR | Status: AC
  Filled 2022-11-22: qty 1

## 2022-11-22 MED ORDER — DIPHENHYDRAMINE HCL 50 MG/ML IJ SOLN
INTRAMUSCULAR | Status: DC | PRN
  Administered 2022-11-22: 12.5 mg via INTRAVENOUS

## 2022-11-22 MED ORDER — PROPOFOL 10 MG/ML IV BOLUS
INTRAVENOUS | Status: DC | PRN
  Administered 2022-11-22: 20 mg via INTRAVENOUS
  Administered 2022-11-22: 150 mg via INTRAVENOUS

## 2022-11-22 MED ORDER — OXYCODONE HCL 5 MG PO TABS: 5.0000 mg | ORAL_TABLET | Freq: Four times a day (QID) | ORAL | 0 refills | Status: AC | PRN

## 2022-11-22 SURGICAL SUPPLY — 51 items
BLADE EXCALIBUR 4.0X13 (MISCELLANEOUS) ×1 IMPLANT
BLADE EXCALIBUR 5.0X13 (MISCELLANEOUS) ×1 IMPLANT
BLADE SURG 15 STRL LF DISP TIS (BLADE) IMPLANT
BLADE SURG 15 STRL SS (BLADE) ×1
BNDG CMPR 5X4 CHSV STRCH STRL (GAUZE/BANDAGES/DRESSINGS) ×1
BNDG COHESIVE 4X5 TAN STRL LF (GAUZE/BANDAGES/DRESSINGS) IMPLANT
CANNULA 5.75X7 CRYSTAL CLEAR (CANNULA) ×1 IMPLANT
CONNECTOR 5 IN 1 STRAIGHT STRL (MISCELLANEOUS) ×1 IMPLANT
DISSECTOR  3.8MM X 13CM (MISCELLANEOUS) ×1
DISSECTOR 3.8MM X 13CM (MISCELLANEOUS) IMPLANT
DRAPE ORTHO SPLIT 77X108 STRL (DRAPES) ×2
DRAPE POUCH INSTRU U-SHP 10X18 (DRAPES) ×1 IMPLANT
DRAPE SHEET LG 3/4 BI-LAMINATE (DRAPES) ×1 IMPLANT
DRAPE STERI 35X30 U-POUCH (DRAPES) ×1 IMPLANT
DRAPE SURG 17X23 STRL (DRAPES) ×1 IMPLANT
DRAPE SURG ORHT 6 SPLT 77X108 (DRAPES) ×2 IMPLANT
DRAPE U-SHAPE 47X51 STRL (DRAPES) ×1 IMPLANT
DURAPREP 26ML APPLICATOR (WOUND CARE) ×1 IMPLANT
DW OUTFLOW CASSETTE/TUBE SET (MISCELLANEOUS) ×1 IMPLANT
ELECT REM PT RETURN 9FT ADLT (ELECTROSURGICAL) ×1
ELECTRODE REM PT RTRN 9FT ADLT (ELECTROSURGICAL) ×1 IMPLANT
FIBER TAPE 2MM (SUTURE) IMPLANT
FIBERSTICK 2 (SUTURE) IMPLANT
GAUZE 4X4 16PLY ~~LOC~~+RFID DBL (SPONGE) ×1 IMPLANT
GAUZE PAD ABD 8X10 STRL (GAUZE/BANDAGES/DRESSINGS) ×1 IMPLANT
GAUZE SPONGE 4X4 12PLY STRL (GAUZE/BANDAGES/DRESSINGS) ×1 IMPLANT
GAUZE XEROFORM 1X8 LF (GAUZE/BANDAGES/DRESSINGS) ×1 IMPLANT
GLOVE BIO SURGEON STRL SZ7 (GLOVE) ×1 IMPLANT
GLOVE BIO SURGEON STRL SZ8 (GLOVE) ×1 IMPLANT
GLOVE BIOGEL PI IND STRL 7.5 (GLOVE) ×1 IMPLANT
GLOVE BIOGEL PI IND STRL 8 (GLOVE) ×1 IMPLANT
GLOVE SURG SS PI 7.5 STRL IVOR (GLOVE) IMPLANT
GOWN STRL REUS W/TWL LRG LVL3 (GOWN DISPOSABLE) IMPLANT
GOWN STRL REUS W/TWL XL LVL3 (GOWN DISPOSABLE) ×2 IMPLANT
IV NS IRRIG 3000ML ARTHROMATIC (IV SOLUTION) ×4 IMPLANT
KIT TURNOVER CYSTO (KITS) ×1 IMPLANT
MANIFOLD NEPTUNE II (INSTRUMENTS) ×1 IMPLANT
PACK ARTHROSCOPY DSU (CUSTOM PROCEDURE TRAY) ×1 IMPLANT
PACK BASIN DAY SURGERY FS (CUSTOM PROCEDURE TRAY) ×1 IMPLANT
PAD ARMBOARD 7.5X6 YLW CONV (MISCELLANEOUS) IMPLANT
PORT APPOLLO RF 90DEGREE MULTI (SURGICAL WAND) ×1 IMPLANT
SLEEVE ARM SUSPENSION SYSTEM (MISCELLANEOUS) ×1 IMPLANT
SLING ARM FOAM STRAP LRG (SOFTGOODS) IMPLANT
SLING ULTRA II L (ORTHOPEDIC SUPPLIES) ×1 IMPLANT
SPIKE FLUID TRANSFER (MISCELLANEOUS) ×1 IMPLANT
SUT ETHILON 3 0 PS 1 (SUTURE) ×1 IMPLANT
SYR CONTROL 10ML LL (SYRINGE) IMPLANT
SYR TB 1ML LL NO SAFETY (SYRINGE) IMPLANT
TOWEL OR 17X26 10 PK STRL BLUE (TOWEL DISPOSABLE) ×1 IMPLANT
TUBE CONNECTING 12X1/4 (SUCTIONS) ×2 IMPLANT
TUBING ARTHROSCOPY IRRIG 16FT (MISCELLANEOUS) ×1 IMPLANT

## 2022-11-22 NOTE — Transfer of Care (Signed)
Immediate Anesthesia Transfer of Care Note  Patient: [facility]  Procedure(s) Performed: SHOULDER ARTHROSCOPY WITH SUBACROMIAL DECOMPRESSION AND LABRAL DEBRIDEMENT (Right: Shoulder) SHOULDER ARTHROSCOPY WITH DISTAL CLAVICLE RESECTION (Right: Shoulder)  Patient Location: PACU  Anesthesia Type:General and Regional  Level of Consciousness: awake and patient cooperative  Airway & Oxygen Therapy: Patient Spontanous Breathing and Patient connected to face mask oxygen  Post-op Assessment: Report given to RN and Post -op Vital signs reviewed and stable  Post vital signs: Reviewed and stable  Last Vitals:  Vitals Value Taken Time  BP    Temp    Pulse    Resp    SpO2      Last Pain:  Vitals:   11/22/22 1018  TempSrc: Oral  PainSc: 0-No pain         Complications: No notable events documented.

## 2022-11-22 NOTE — Anesthesia Postprocedure Evaluation (Signed)
Anesthesia Post Note  Patient: [facility]  Procedure(s) Performed: SHOULDER ARTHROSCOPY WITH SUBACROMIAL DECOMPRESSION AND LABRAL DEBRIDEMENT (Right: Shoulder) SHOULDER ARTHROSCOPY WITH DISTAL CLAVICLE RESECTION (Right: Shoulder)     Patient location during evaluation: PACU Anesthesia Type: Regional and General Level of consciousness: awake and alert Pain management: pain level controlled Vital Signs Assessment: post-procedure vital signs reviewed and stable Respiratory status: spontaneous breathing, nonlabored ventilation, respiratory function stable and patient connected to nasal cannula oxygen Cardiovascular status: blood pressure returned to baseline and stable Postop Assessment: no apparent nausea or vomiting Anesthetic complications: no  No notable events documented.  Last Vitals:  Vitals:   11/22/22 1542 11/22/22 1600  BP:  (!) 142/91  Pulse: 64 65  Resp:  16  Temp:  (!) 36.2 C  SpO2: 96% 96%    Last Pain:  Vitals:   11/22/22 1542  TempSrc:   PainSc: 0-No pain                 [name] Glasgow

## 2022-11-22 NOTE — Interval H&P Note (Signed)
History and Physical Interval Note:  11/22/2022 1:02 PM  [facility]  has presented today for surgery, with the diagnosis of Right shoulder labral tear, rotator cuff impingement syndrome, acromioclavicular osteoarthritis.  The various methods of treatment have been discussed with the patient and family. After consideration of risks, benefits and other options for treatment, the patient has consented to  Procedure(s) with comments: SHOULDER ARTHROSCOPY WITH SUBACROMIAL DECOMPRESSION AND LABRAL DEBRIDEMENT (Right) - with interscalene block SHOULDER ARTHROSCOPY WITH DISTAL CLAVICLE RESECTION (Right) as a surgical intervention.  The patient's history has been reviewed, patient examined, no change in status, stable for surgery.  I have reviewed the patient's chart and labs.  Questions were answered to the patient's satisfaction.     Douglass Dunshee ANDREW

## 2022-11-22 NOTE — Anesthesia Procedure Notes (Addendum)
Procedure Name: Intubation Date/Time: 11/22/2022 1:18 PM  Performed by: Rogers Blocker, CRNAPre-anesthesia Checklist: Patient identified, Emergency Drugs available, Suction available and Patient being monitored Patient Re-evaluated:Patient Re-evaluated prior to induction Oxygen Delivery Method: Circle System Utilized Preoxygenation: Pre-oxygenation with 100% oxygen Induction Type: IV induction Ventilation: Mask ventilation without difficulty Laryngoscope Size: Mac and 4 Grade View: Grade II Tube type: Oral Tube size: 7.5 mm Number of attempts: 1 Airway Equipment and Method: Stylet and Bite block Placement Confirmation: ETT inserted through vocal cords under direct vision, positive ETCO2 and breath sounds checked- equal and bilateral Secured at: 24 cm Tube secured with: Tape Dental Injury: Teeth and Oropharynx as per pre-operative assessment

## 2022-11-22 NOTE — Discharge Instructions (Addendum)
  Post Anesthesia Home Care Instructions  Activity: Get plenty of rest for the remainder of the day. A responsible adult should stay with you for 24 hours following the procedure.  For the next 24 hours, DO NOT: -Drive a car -Paediatric nurse -Drink alcoholic beverages -Take any medication unless instructed by your physician -Make any legal decisions or sign important papers.  Meals: Start with liquid foods such as gelatin or soup. Progress to regular foods as tolerated. Avoid greasy, spicy, heavy foods. If nausea and/or vomiting occur, drink only clear liquids until the nausea and/or vomiting subsides. Call your physician if vomiting continues.  Special Instructions/Symptoms: Your throat may feel dry or sore from the anesthesia or the breathing tube placed in your throat during surgery. If this causes discomfort, gargle with warm salt water. The discomfort should disappear within 24 hours.    Information for Discharge Teaching: EXPAREL (bupivacaine liposome injectable suspension)   Your surgeon gave you EXPAREL(bupivacaine) in your surgical incision to help control your pain after surgery.  EXPAREL is a local anesthetic that provides pain relief by numbing the tissue around the surgical site. EXPAREL is designed to release pain medication over time and can control pain for up to 72 hours. Depending on how you respond to EXPAREL, you may require less pain medication during your recovery.  Possible side effects: Temporary loss of sensation or ability to move in the area where bupivacaine was injected. Nausea, vomiting, constipation Rarely, numbness and tingling in your mouth or lips, lightheadedness, or anxiety may occur. Call your doctor right away if you think you may be experiencing any of these sensations, or if you have other questions regarding possible side effects.  Follow all other discharge instructions given to you by your surgeon or nurse. Eat a healthy diet and drink  plenty of water or other fluids.  If you return to the hospital for any reason within 96 hours following the administration of EXPAREL, please inform your health care providers.

## 2022-11-22 NOTE — Op Note (Signed)
Preop diagnosis right shoulder impingement syndrome #2 AC joint osteoarthritis #3 partial rotator cuff tear low-grade  .  Diagnosis #1 right shoulder impingement syndrome with low-grade partial rotator cuff tear supraspinatus #2 AC joint osteoarthritis #3 Superior labral degenerative tears Procedure #1 right shoulder glenohumeral arthroscopy with labral debridement #2 arthroscopic subacromial decompression with acromioplasty bursectomy CA ligament release #3 arthroscopic distal clavicle resection Mumford procedure Surgeon Hart Robinsons, MD Assistant Elenor Legato, PA-C Anesthesia scalene by general Estimated blood loss minimal Complications none Disposition PACU stable  Operative details Patient was encountered holding area  identified marked signed appropriate IV started Parq was given chart was signed appropriately.  IV antibiotics were given within 1 hour of the incision time.  Patient had operating room placed under general anesthesia.  PAS stockings were applied for DVT prophylaxis.  Gently turned to a left lateral decubitus position.  Prepped with ChloraPrep and draped into a sterile fashion.  Very carefully Pap and bumped and protected.  We utilized our head shoulder positioner 30 degrees of abduction 30 degrees for flexion 15 pounds of longitudinal traction to make sure we did not over distracting.  After timeout a posterior portal was created arthroscope was placed to the glenohumeral joint.  050 was superior labral tear and degenerative type I biceps intact Slevin capsule rotator cuff intact articular was normal ligaments normal stability normal.  Anterior portal was made from outside in technique through the rotator cuff interval shavers at this labrum was debrided back to healthy tissue smoothed down with cautery.  Scope was placed into the subacromial region very thick subacromial subdeltoid bursitis was encountered lateral portal was established neurovascular structures were protected into  the Nerve and subacromial subdeltoid bursectomy was performed.  The rotator cuff from the bursal surface showed a low-grade partial tear at the supraspinatus and at the CA arch consistent with extrinsic impingement.  Burst placed posteriorly anterior inferior lateral acromioplasty was performed converting to a flat type I acromion morphology.  Bur was placed from the anterior portal into the [facility] joint there was underlying clinical subclavicular spur and moderate osteoarthritic changes at the distal clavicle.  The bur was utilized to perform a distal clavicle resection removing approximately 8 mm of the distal clavicle back to healthy bone making sure to leave the superior and posterior capsule intact the clavicle was palpated was found to be in intact and stable.  The area was shaved and debrided hemostasis was obtained no other abnormalities were noted irrigated arthroscopic equipment was removed.  Portals closed with 4-0 nylon suture.  Sterile dressing was applied and a sling he was awakened taken from the operating room to the PACU in stable condition.  To help with patient positioning prepping draping technical surgical assistance throughout the entire case protection of neurovascular structures closure wounds application of sling Ms. Elenor Legato, PA-C assistance was needed.

## 2022-11-22 NOTE — H&P (Signed)
[facility] is an 57 y.o. male.   Chief Complaint: Right shoulder pain HPI: This is a pleasant 57 year old male who is here today for his right shoulder.  Patient has been experiencing right shoulder pain for about 10 months now.  Denies any injury that he can think of.  He started anti-inflammatories for this.  He is also tried physical therapy with minimal relief.  MRI scan showed labral tearing as well as AC OA and rotator cuff impingement.  Surgical versus nonsurgical management was discussed with the patient in the office.  He has elected to proceed with surgical management at this time.  He will be having right shoulder arthroscopy, subacromial decompression, distal clavicle resection and labral debridement.  Past Medical History:  Diagnosis Date   Allergy    Bronchitis    Per pt comes and goes, uses inhaler   Diabetes mellitus without complication ([place]    Scoliosis    Shoulder injury    Recurring injury in L, tear in R    Past Surgical History:  Procedure Laterality Date   WISDOM TOOTH EXTRACTION     early 85s    Family History  Problem Relation Age of Onset   Diabetes Mother    Stroke Mother    Diabetes Father    Hyperlipidemia Father    Hypertension Father    Social History:  reports that he has never smoked. He has never used smokeless tobacco. He reports that he does not drink alcohol and does not use drugs.  Allergies: No Known Allergies  Medications Prior to Admission  Medication Sig Dispense Refill   Albuterol Sulfate 108 (90 Base) MCG/ACT AEPB Inhale 2 puffs into the lungs every 4 (four) hours as needed (for shortness of breath or wheezing).     NON FORMULARY Take 1 each by mouth daily. Sea moss gel     vitamin C (VITAMIN C) 500 MG tablet Take 1 tablet (500 mg total) by mouth daily. 20 tablet 0   ibuprofen (ADVIL) 200 MG tablet Take 200 mg by mouth every 6 (six) hours as needed.     metFORMIN (GLUCOPHAGE) 500 MG tablet Take 1 tablet (500 mg total) by mouth 2  (two) times daily with a meal. (Patient not taking: Reported on 11/22/2022) 180 tablet 3   zinc sulfate 220 (50 Zn) MG capsule Take 1 capsule (220 mg total) by mouth daily. (Patient not taking: Reported on 11/05/2019) 20 capsule 0    Results for orders placed or performed during the hospital encounter of 11/22/22 (from the past 48 hour(s))  Glucose, capillary     Status: Abnormal   Collection Time: 11/22/22 10:44 AM  Result Value Ref Range   Glucose-Capillary 350 (H) 70 - 99 mg/dL    Comment: Glucose reference range applies only to samples taken after fasting for at least 8 hours.  Basic metabolic panel     Status: Abnormal   Collection Time: 11/22/22 10:53 AM  Result Value Ref Range   Sodium 133 (L) 135 - 145 mmol/L   Potassium 3.8 3.5 - 5.1 mmol/L   Chloride 101 98 - 111 mmol/L   CO2 22 22 - 32 mmol/L   Glucose, Bld 356 (H) 70 - 99 mg/dL    Comment: Glucose reference range applies only to samples taken after fasting for at least 8 hours.   BUN 14 6 - 20 mg/dL   Creatinine, Ser 0.87 0.61 - 1.24 mg/dL   Calcium 8.9 8.9 - 10.3 mg/dL   GFR,  Estimated >60 >60 mL/min    Comment: (NOTE) Calculated using the CKD-EPI Creatinine Equation (2021)    Anion gap 10 5 - 15    Comment: Performed at [facility], [place] [address], [place], [place] [address]   No results found.  Review of Systems  All other systems reviewed and are negative.   Blood pressure (!) 153/107, pulse 71, temperature 97.9 F (36.6 C), temperature source Oral, resp. rate 18, height 6\' 3"  (1.905 m), weight 88.5 kg, SpO2 98 %. Physical Exam Constitutional:      Appearance: He is normal weight.  HENT:     Head: Normocephalic and atraumatic.  Cardiovascular:     Rate and Rhythm: Normal rate and regular rhythm.     Pulses: Normal pulses.     Heart sounds: Normal heart sounds. No murmur heard.    No friction rub. No gallop.  Pulmonary:     Effort: Pulmonary effort is normal.     Breath sounds: Normal  breath sounds.  Musculoskeletal:        General: Tenderness (AC Joint) present.  Skin:    General: Skin is warm and dry.     Capillary Refill: Capillary refill takes less than 2 seconds.  Neurological:     General: No focal deficit present.     Mental Status: He is alert and oriented to person, place, and time.  Psychiatric:        Mood and Affect: Mood normal.        Behavior: Behavior normal.        Thought Content: Thought content normal.        Judgment: Judgment normal.      Assessment/Plan Right shoulder rotator cuff impingement syndrome, labral tearing, AC joint osteoarthritis: Patient has right shoulder impingement syndrome.  During [facility] joint OA that has failed conservative management up to this point.  He has elected to proceed with surgical management at this time.  He will be having a right shoulder arthroscopy, SA D, DCR and labral debridement.  Risks and benefits of surgery versus nonsurgical were discussed with the patient in the office.  He will follow-up in 2 weeks in office.   Drue Novel, PA 11/22/2022, 12:06 PM

## 2022-11-22 NOTE — H&P (View-Only) (Signed)
Assisted Dr. Houser with right, interscalene , ultrasound guided block. Side rails up, monitors on throughout procedure. See vital signs in flow sheet. Tolerated Procedure well. 

## 2022-11-22 NOTE — Anesthesia Procedure Notes (Signed)
Anesthesia Regional Block: Interscalene brachial plexus block   Pre-Anesthetic Checklist: , timeout performed,  Correct Patient, Correct Site, Correct Laterality,  Correct Procedure, Correct Position, site marked,  Risks and benefits discussed,  Surgical consent,  Pre-op evaluation,  At surgeon's request and post-op pain management  Laterality: Upper and Right  Prep: Maximum Sterile Barrier Precautions used, chloraprep       Needles:  Injection technique: Single-shot  Needle Type: Echogenic Needle     Needle Length: 5cm  Needle Gauge: 21     Additional Needles:   Procedures:,,,, ultrasound used (permanent image in chart),,    Narrative:  Start time: 11/22/2022 12:18 PM End time: 11/22/2022 12:24 PM Injection made incrementally with aspirations every 5 mL.  Performed by: Personally  Anesthesiologist: [name] Glasgow, MD  Additional Notes: Block assessed prior to procedure. Patient tolerated procedure well.

## 2022-11-22 NOTE — Progress Notes (Signed)
Assisted Dr. Houser with right, interscalene , ultrasound guided block. Side rails up, monitors on throughout procedure. See vital signs in flow sheet. Tolerated Procedure well. 

## 2022-11-23 ENCOUNTER — Encounter (HOSPITAL_BASED_OUTPATIENT_CLINIC_OR_DEPARTMENT_OTHER): Payer: Self-pay | Admitting: Specialist

## 2023-02-05 IMAGING — CR DG SHOULDER 2+V*R*
3 series · 3 of 3 positions shown · non-contrast
Comparison: None Available.

CLINICAL DATA: Right shoulder pain for 3 weeks.

EXAM:
RIGHT SHOULDER - 2+ VIEW

[shoulder grashey]
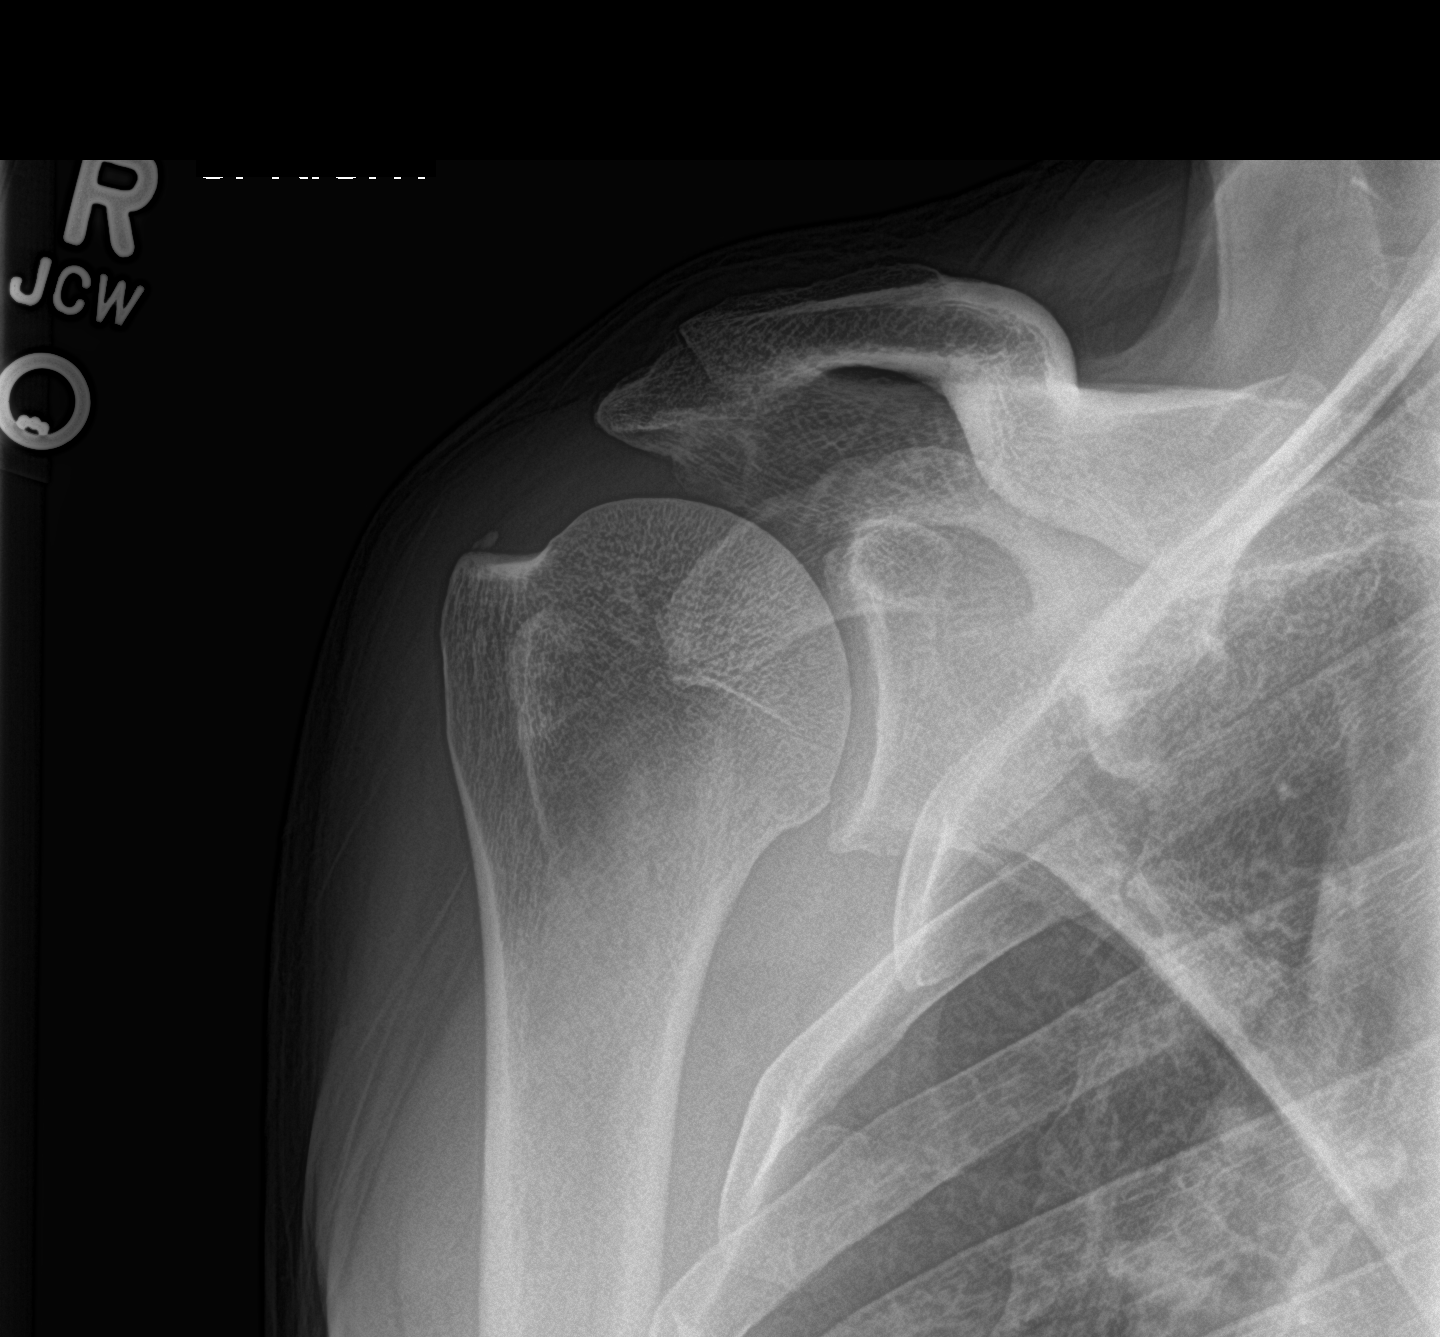

[shoulder axillary]
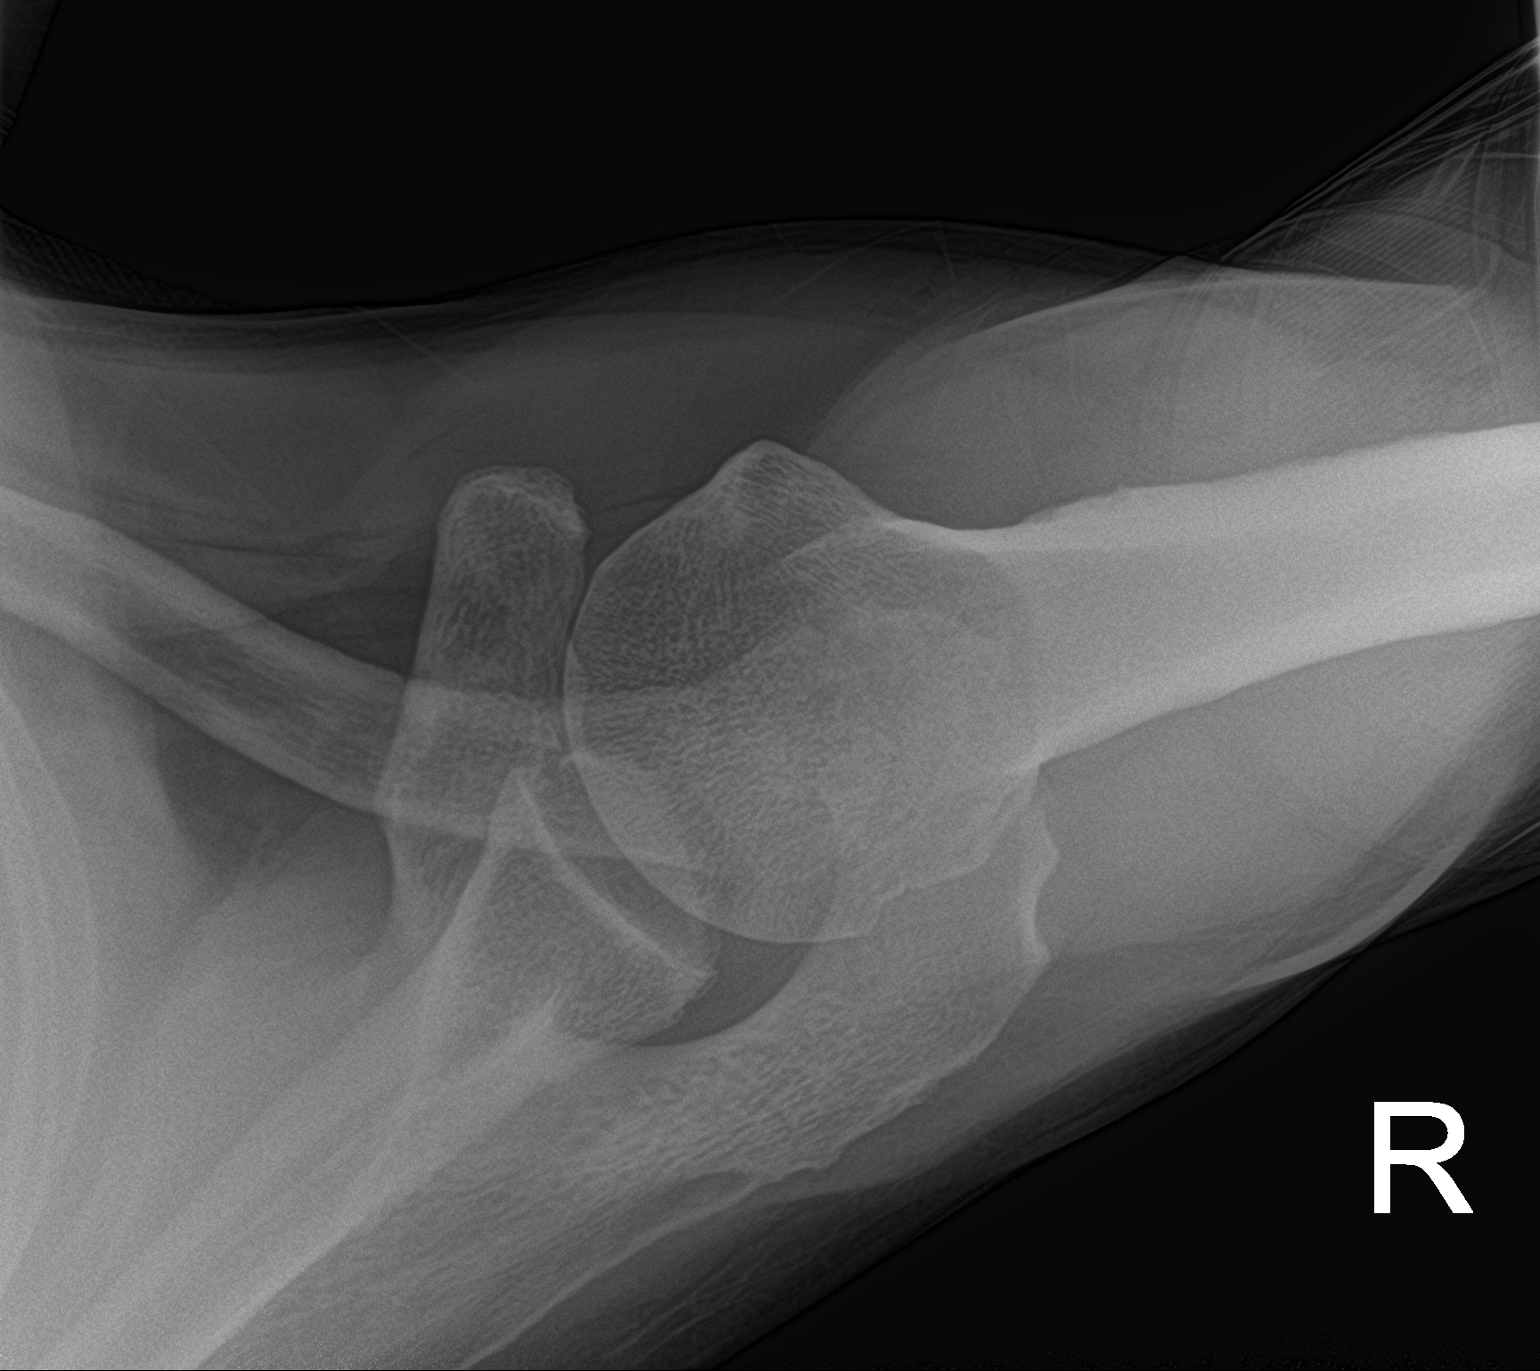

[shoulder y view]
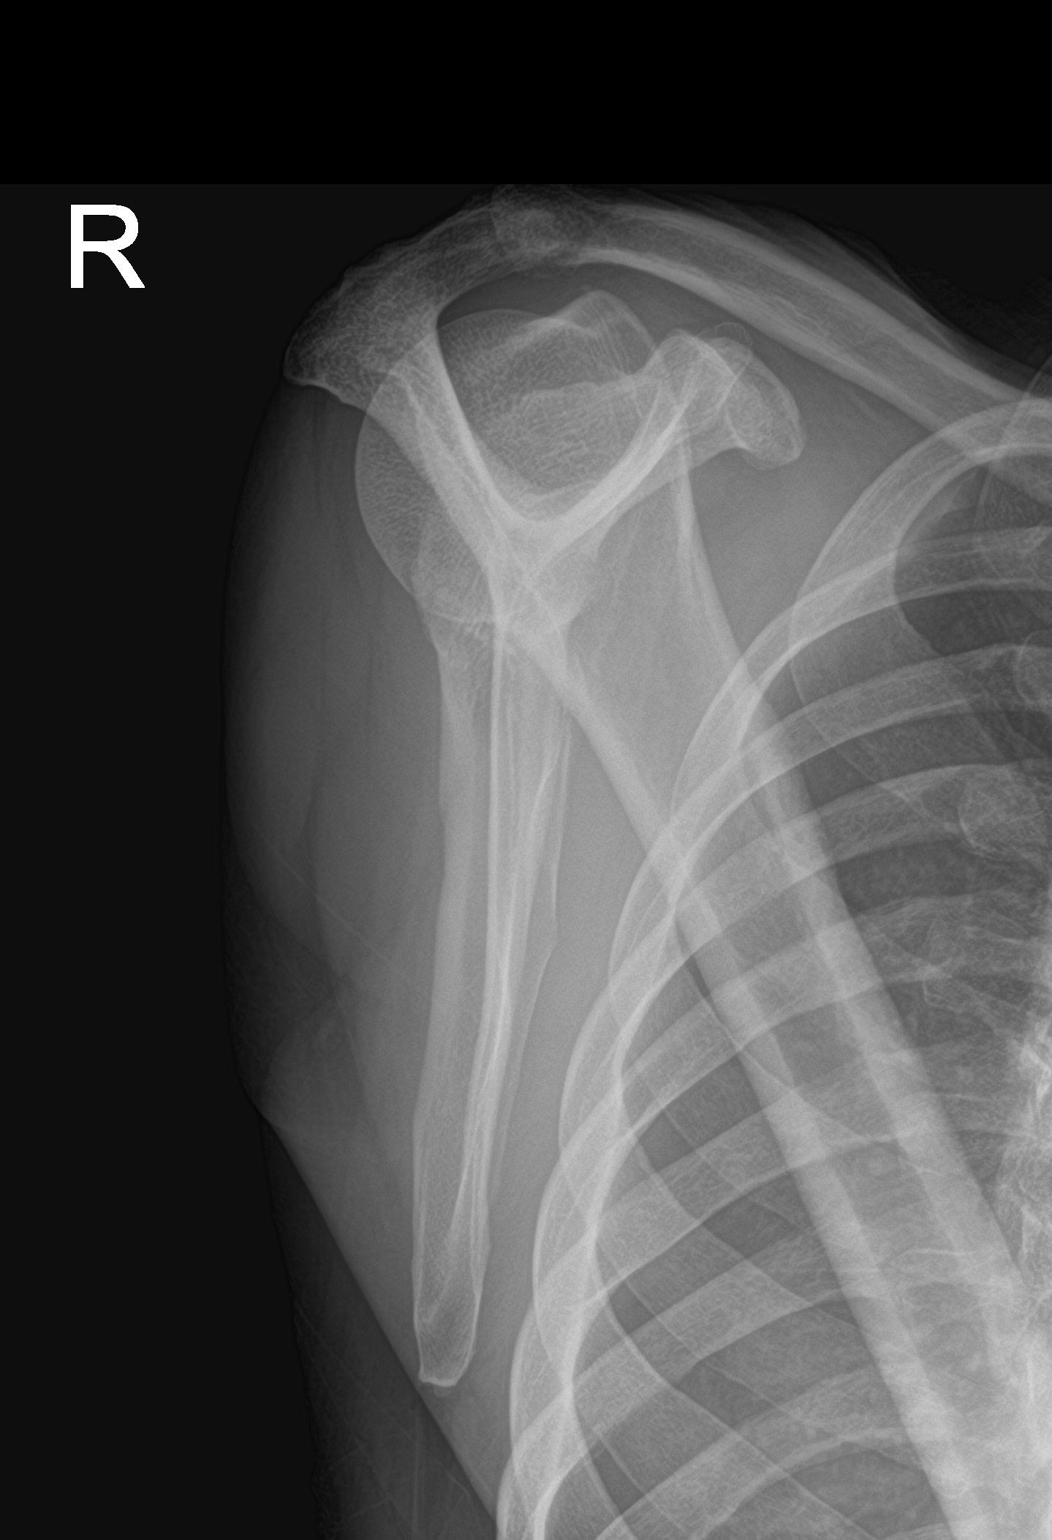

[3 of 3 positions shown; findings below may reference images not displayed]

FINDINGS: No acute fracture or dislocation is identified. Glenohumeral joint
space width is preserved. A 4 mm calcification is noted in the soft
tissues adjacent to the greater tuberosity.
IMPRESSION: 1. No acute osseous abnormality.
2. Possible rotator cuff calcific tendinitis.

## 2023-05-27 DIAGNOSIS — I639 Cerebral infarction, unspecified: Secondary | ICD-10-CM

## 2023-05-27 HISTORY — DX: Cerebral infarction, unspecified: I63.9

## 2023-06-06 DIAGNOSIS — G8191 Hemiplegia, unspecified affecting right dominant side: Secondary | ICD-10-CM | POA: Diagnosis not present

## 2023-06-06 DIAGNOSIS — I639 Cerebral infarction, unspecified: Secondary | ICD-10-CM | POA: Diagnosis not present

## 2023-06-06 DIAGNOSIS — M6281 Muscle weakness (generalized): Secondary | ICD-10-CM | POA: Diagnosis not present

## 2023-06-06 DIAGNOSIS — S0990XA Unspecified injury of head, initial encounter: Secondary | ICD-10-CM | POA: Diagnosis not present

## 2023-06-06 DIAGNOSIS — Y92239 Unspecified place in hospital as the place of occurrence of the external cause: Secondary | ICD-10-CM | POA: Diagnosis not present

## 2023-06-06 DIAGNOSIS — R531 Weakness: Secondary | ICD-10-CM | POA: Diagnosis not present

## 2023-06-06 DIAGNOSIS — W07XXXA Fall from chair, initial encounter: Secondary | ICD-10-CM | POA: Diagnosis not present

## 2023-06-06 DIAGNOSIS — R471 Dysarthria and anarthria: Secondary | ICD-10-CM | POA: Diagnosis not present

## 2023-06-06 DIAGNOSIS — R739 Hyperglycemia, unspecified: Secondary | ICD-10-CM | POA: Diagnosis not present

## 2023-06-06 DIAGNOSIS — R519 Headache, unspecified: Secondary | ICD-10-CM | POA: Diagnosis not present

## 2023-06-06 DIAGNOSIS — E1110 Type 2 diabetes mellitus with ketoacidosis without coma: Secondary | ICD-10-CM | POA: Diagnosis not present

## 2023-06-06 DIAGNOSIS — R29707 NIHSS score 7: Secondary | ICD-10-CM | POA: Diagnosis not present

## 2023-06-06 DIAGNOSIS — G9389 Other specified disorders of brain: Secondary | ICD-10-CM | POA: Diagnosis not present

## 2023-06-06 DIAGNOSIS — Z791 Long term (current) use of non-steroidal anti-inflammatories (NSAID): Secondary | ICD-10-CM | POA: Diagnosis not present

## 2023-06-06 DIAGNOSIS — I6782 Cerebral ischemia: Secondary | ICD-10-CM | POA: Diagnosis not present

## 2023-06-06 DIAGNOSIS — I10 Essential (primary) hypertension: Secondary | ICD-10-CM | POA: Diagnosis not present

## 2023-06-06 DIAGNOSIS — M419 Scoliosis, unspecified: Secondary | ICD-10-CM | POA: Diagnosis not present

## 2023-06-06 DIAGNOSIS — I63512 Cerebral infarction due to unspecified occlusion or stenosis of left middle cerebral artery: Secondary | ICD-10-CM | POA: Diagnosis not present

## 2023-06-06 DIAGNOSIS — R0989 Other specified symptoms and signs involving the circulatory and respiratory systems: Secondary | ICD-10-CM | POA: Diagnosis not present

## 2023-06-06 DIAGNOSIS — I6602 Occlusion and stenosis of left middle cerebral artery: Secondary | ICD-10-CM | POA: Diagnosis not present

## 2023-06-06 DIAGNOSIS — Z7984 Long term (current) use of oral hypoglycemic drugs: Secondary | ICD-10-CM | POA: Diagnosis not present

## 2023-06-06 DIAGNOSIS — Y939 Activity, unspecified: Secondary | ICD-10-CM | POA: Diagnosis not present

## 2023-06-07 DIAGNOSIS — E1110 Type 2 diabetes mellitus with ketoacidosis without coma: Secondary | ICD-10-CM | POA: Diagnosis not present

## 2023-06-07 DIAGNOSIS — I639 Cerebral infarction, unspecified: Secondary | ICD-10-CM | POA: Diagnosis not present

## 2023-06-07 DIAGNOSIS — I10 Essential (primary) hypertension: Secondary | ICD-10-CM | POA: Diagnosis not present

## 2023-06-07 DIAGNOSIS — R471 Dysarthria and anarthria: Secondary | ICD-10-CM | POA: Diagnosis not present

## 2023-06-07 DIAGNOSIS — I63512 Cerebral infarction due to unspecified occlusion or stenosis of left middle cerebral artery: Secondary | ICD-10-CM | POA: Diagnosis not present

## 2023-06-07 DIAGNOSIS — G8191 Hemiplegia, unspecified affecting right dominant side: Secondary | ICD-10-CM | POA: Diagnosis not present

## 2023-06-08 DIAGNOSIS — I10 Essential (primary) hypertension: Secondary | ICD-10-CM | POA: Diagnosis not present

## 2023-06-08 DIAGNOSIS — I63512 Cerebral infarction due to unspecified occlusion or stenosis of left middle cerebral artery: Secondary | ICD-10-CM | POA: Diagnosis not present

## 2023-06-08 DIAGNOSIS — I639 Cerebral infarction, unspecified: Secondary | ICD-10-CM | POA: Diagnosis not present

## 2023-06-08 DIAGNOSIS — G8191 Hemiplegia, unspecified affecting right dominant side: Secondary | ICD-10-CM | POA: Diagnosis not present

## 2023-06-08 DIAGNOSIS — E1110 Type 2 diabetes mellitus with ketoacidosis without coma: Secondary | ICD-10-CM | POA: Diagnosis not present

## 2023-06-08 DIAGNOSIS — R471 Dysarthria and anarthria: Secondary | ICD-10-CM | POA: Diagnosis not present

## 2023-06-09 DIAGNOSIS — G8191 Hemiplegia, unspecified affecting right dominant side: Secondary | ICD-10-CM | POA: Diagnosis not present

## 2023-06-09 DIAGNOSIS — R519 Headache, unspecified: Secondary | ICD-10-CM | POA: Diagnosis not present

## 2023-06-09 DIAGNOSIS — I639 Cerebral infarction, unspecified: Secondary | ICD-10-CM | POA: Diagnosis not present

## 2023-06-09 DIAGNOSIS — I63512 Cerebral infarction due to unspecified occlusion or stenosis of left middle cerebral artery: Secondary | ICD-10-CM | POA: Diagnosis not present

## 2023-06-09 DIAGNOSIS — E1110 Type 2 diabetes mellitus with ketoacidosis without coma: Secondary | ICD-10-CM | POA: Diagnosis not present

## 2023-06-09 DIAGNOSIS — S0990XA Unspecified injury of head, initial encounter: Secondary | ICD-10-CM | POA: Diagnosis not present

## 2023-06-09 DIAGNOSIS — R471 Dysarthria and anarthria: Secondary | ICD-10-CM | POA: Diagnosis not present

## 2023-06-09 DIAGNOSIS — I10 Essential (primary) hypertension: Secondary | ICD-10-CM | POA: Diagnosis not present

## 2023-06-10 DIAGNOSIS — E1110 Type 2 diabetes mellitus with ketoacidosis without coma: Secondary | ICD-10-CM | POA: Diagnosis not present

## 2023-06-10 DIAGNOSIS — I63512 Cerebral infarction due to unspecified occlusion or stenosis of left middle cerebral artery: Secondary | ICD-10-CM | POA: Diagnosis not present

## 2023-06-10 DIAGNOSIS — I639 Cerebral infarction, unspecified: Secondary | ICD-10-CM | POA: Diagnosis not present

## 2023-06-10 DIAGNOSIS — R471 Dysarthria and anarthria: Secondary | ICD-10-CM | POA: Diagnosis not present

## 2023-06-10 DIAGNOSIS — G8191 Hemiplegia, unspecified affecting right dominant side: Secondary | ICD-10-CM | POA: Diagnosis not present

## 2023-06-10 DIAGNOSIS — I10 Essential (primary) hypertension: Secondary | ICD-10-CM | POA: Diagnosis not present

## 2023-06-11 DIAGNOSIS — G4700 Insomnia, unspecified: Secondary | ICD-10-CM | POA: Diagnosis not present

## 2023-06-11 DIAGNOSIS — E1165 Type 2 diabetes mellitus with hyperglycemia: Secondary | ICD-10-CM | POA: Diagnosis not present

## 2023-06-11 DIAGNOSIS — I10 Essential (primary) hypertension: Secondary | ICD-10-CM | POA: Diagnosis not present

## 2023-06-11 DIAGNOSIS — Z79899 Other long term (current) drug therapy: Secondary | ICD-10-CM | POA: Diagnosis not present

## 2023-06-11 DIAGNOSIS — Z7901 Long term (current) use of anticoagulants: Secondary | ICD-10-CM | POA: Diagnosis not present

## 2023-06-11 DIAGNOSIS — M25519 Pain in unspecified shoulder: Secondary | ICD-10-CM | POA: Diagnosis not present

## 2023-06-11 DIAGNOSIS — G629 Polyneuropathy, unspecified: Secondary | ICD-10-CM | POA: Diagnosis not present

## 2023-06-11 DIAGNOSIS — I69351 Hemiplegia and hemiparesis following cerebral infarction affecting right dominant side: Secondary | ICD-10-CM | POA: Diagnosis not present

## 2023-06-11 DIAGNOSIS — Z9181 History of falling: Secondary | ICD-10-CM | POA: Diagnosis not present

## 2023-06-11 DIAGNOSIS — I639 Cerebral infarction, unspecified: Secondary | ICD-10-CM | POA: Diagnosis not present

## 2023-06-11 DIAGNOSIS — E559 Vitamin D deficiency, unspecified: Secondary | ICD-10-CM | POA: Diagnosis not present

## 2023-06-11 DIAGNOSIS — Z0000 Encounter for general adult medical examination without abnormal findings: Secondary | ICD-10-CM | POA: Diagnosis not present

## 2023-06-11 DIAGNOSIS — R040 Epistaxis: Secondary | ICD-10-CM | POA: Diagnosis not present

## 2023-06-11 DIAGNOSIS — Z794 Long term (current) use of insulin: Secondary | ICD-10-CM | POA: Diagnosis not present

## 2023-06-11 DIAGNOSIS — Z8249 Family history of ischemic heart disease and other diseases of the circulatory system: Secondary | ICD-10-CM | POA: Diagnosis not present

## 2023-06-11 DIAGNOSIS — Z7982 Long term (current) use of aspirin: Secondary | ICD-10-CM | POA: Diagnosis not present

## 2023-06-11 DIAGNOSIS — M75100 Unspecified rotator cuff tear or rupture of unspecified shoulder, not specified as traumatic: Secondary | ICD-10-CM | POA: Diagnosis not present

## 2023-06-11 DIAGNOSIS — I69398 Other sequelae of cerebral infarction: Secondary | ICD-10-CM | POA: Diagnosis not present

## 2023-06-11 DIAGNOSIS — M21371 Foot drop, right foot: Secondary | ICD-10-CM | POA: Diagnosis not present

## 2023-06-11 DIAGNOSIS — I69322 Dysarthria following cerebral infarction: Secondary | ICD-10-CM | POA: Diagnosis not present

## 2023-06-11 DIAGNOSIS — R0989 Other specified symptoms and signs involving the circulatory and respiratory systems: Secondary | ICD-10-CM | POA: Diagnosis not present

## 2023-06-13 DIAGNOSIS — Z0000 Encounter for general adult medical examination without abnormal findings: Secondary | ICD-10-CM | POA: Diagnosis not present

## 2023-06-17 DIAGNOSIS — Z0000 Encounter for general adult medical examination without abnormal findings: Secondary | ICD-10-CM | POA: Diagnosis not present

## 2023-06-18 DIAGNOSIS — Z0000 Encounter for general adult medical examination without abnormal findings: Secondary | ICD-10-CM | POA: Diagnosis not present

## 2023-06-20 DIAGNOSIS — Z0000 Encounter for general adult medical examination without abnormal findings: Secondary | ICD-10-CM | POA: Diagnosis not present

## 2023-06-24 DIAGNOSIS — Z0000 Encounter for general adult medical examination without abnormal findings: Secondary | ICD-10-CM | POA: Diagnosis not present

## 2023-06-27 DIAGNOSIS — Z0000 Encounter for general adult medical examination without abnormal findings: Secondary | ICD-10-CM | POA: Diagnosis not present

## 2023-07-01 DIAGNOSIS — Z0000 Encounter for general adult medical examination without abnormal findings: Secondary | ICD-10-CM | POA: Diagnosis not present

## 2023-07-01 DIAGNOSIS — I639 Cerebral infarction, unspecified: Secondary | ICD-10-CM | POA: Diagnosis not present

## 2023-07-02 DIAGNOSIS — I639 Cerebral infarction, unspecified: Secondary | ICD-10-CM | POA: Diagnosis not present

## 2023-07-10 DIAGNOSIS — Z8673 Personal history of transient ischemic attack (TIA), and cerebral infarction without residual deficits: Secondary | ICD-10-CM | POA: Diagnosis not present

## 2023-07-10 DIAGNOSIS — I10 Essential (primary) hypertension: Secondary | ICD-10-CM | POA: Diagnosis not present

## 2023-07-10 DIAGNOSIS — E1165 Type 2 diabetes mellitus with hyperglycemia: Secondary | ICD-10-CM | POA: Diagnosis not present

## 2023-07-31 DIAGNOSIS — I639 Cerebral infarction, unspecified: Secondary | ICD-10-CM | POA: Diagnosis not present

## 2023-08-14 DIAGNOSIS — G8190 Hemiplegia, unspecified affecting unspecified side: Secondary | ICD-10-CM | POA: Diagnosis not present

## 2023-08-14 DIAGNOSIS — E663 Overweight: Secondary | ICD-10-CM | POA: Diagnosis not present

## 2023-08-14 DIAGNOSIS — Z8673 Personal history of transient ischemic attack (TIA), and cerebral infarction without residual deficits: Secondary | ICD-10-CM | POA: Diagnosis not present

## 2023-08-14 DIAGNOSIS — E1165 Type 2 diabetes mellitus with hyperglycemia: Secondary | ICD-10-CM | POA: Diagnosis not present

## 2023-08-14 DIAGNOSIS — Z7189 Other specified counseling: Secondary | ICD-10-CM | POA: Diagnosis not present

## 2023-08-19 DIAGNOSIS — G8191 Hemiplegia, unspecified affecting right dominant side: Secondary | ICD-10-CM | POA: Diagnosis not present

## 2023-08-19 DIAGNOSIS — M25511 Pain in right shoulder: Secondary | ICD-10-CM | POA: Diagnosis not present

## 2023-08-19 DIAGNOSIS — R262 Difficulty in walking, not elsewhere classified: Secondary | ICD-10-CM | POA: Diagnosis not present

## 2023-08-19 DIAGNOSIS — M25611 Stiffness of right shoulder, not elsewhere classified: Secondary | ICD-10-CM | POA: Diagnosis not present

## 2023-08-19 DIAGNOSIS — R278 Other lack of coordination: Secondary | ICD-10-CM | POA: Diagnosis not present

## 2023-08-19 DIAGNOSIS — M6281 Muscle weakness (generalized): Secondary | ICD-10-CM | POA: Diagnosis not present

## 2023-08-21 DIAGNOSIS — R278 Other lack of coordination: Secondary | ICD-10-CM | POA: Diagnosis not present

## 2023-08-21 DIAGNOSIS — M25611 Stiffness of right shoulder, not elsewhere classified: Secondary | ICD-10-CM | POA: Diagnosis not present

## 2023-08-21 DIAGNOSIS — R262 Difficulty in walking, not elsewhere classified: Secondary | ICD-10-CM | POA: Diagnosis not present

## 2023-08-21 DIAGNOSIS — M6281 Muscle weakness (generalized): Secondary | ICD-10-CM | POA: Diagnosis not present

## 2023-08-21 DIAGNOSIS — M25511 Pain in right shoulder: Secondary | ICD-10-CM | POA: Diagnosis not present

## 2023-08-21 DIAGNOSIS — G8191 Hemiplegia, unspecified affecting right dominant side: Secondary | ICD-10-CM | POA: Diagnosis not present

## 2023-08-23 DIAGNOSIS — M21371 Foot drop, right foot: Secondary | ICD-10-CM | POA: Diagnosis not present

## 2023-08-26 DIAGNOSIS — M25511 Pain in right shoulder: Secondary | ICD-10-CM | POA: Diagnosis not present

## 2023-08-26 DIAGNOSIS — M6281 Muscle weakness (generalized): Secondary | ICD-10-CM | POA: Diagnosis not present

## 2023-08-26 DIAGNOSIS — R262 Difficulty in walking, not elsewhere classified: Secondary | ICD-10-CM | POA: Diagnosis not present

## 2023-08-26 DIAGNOSIS — G8191 Hemiplegia, unspecified affecting right dominant side: Secondary | ICD-10-CM | POA: Diagnosis not present

## 2023-08-26 DIAGNOSIS — R278 Other lack of coordination: Secondary | ICD-10-CM | POA: Diagnosis not present

## 2023-08-26 DIAGNOSIS — M25611 Stiffness of right shoulder, not elsewhere classified: Secondary | ICD-10-CM | POA: Diagnosis not present

## 2023-08-31 DIAGNOSIS — I639 Cerebral infarction, unspecified: Secondary | ICD-10-CM | POA: Diagnosis not present

## 2023-09-30 DIAGNOSIS — I639 Cerebral infarction, unspecified: Secondary | ICD-10-CM | POA: Diagnosis not present

## 2023-10-10 DIAGNOSIS — I10 Essential (primary) hypertension: Secondary | ICD-10-CM | POA: Diagnosis not present

## 2023-10-10 DIAGNOSIS — Z125 Encounter for screening for malignant neoplasm of prostate: Secondary | ICD-10-CM | POA: Diagnosis not present

## 2023-10-10 DIAGNOSIS — Z8673 Personal history of transient ischemic attack (TIA), and cerebral infarction without residual deficits: Secondary | ICD-10-CM | POA: Diagnosis not present

## 2023-10-10 DIAGNOSIS — Z0000 Encounter for general adult medical examination without abnormal findings: Secondary | ICD-10-CM | POA: Diagnosis not present

## 2023-10-10 DIAGNOSIS — Z1211 Encounter for screening for malignant neoplasm of colon: Secondary | ICD-10-CM | POA: Diagnosis not present

## 2023-10-10 DIAGNOSIS — Z719 Counseling, unspecified: Secondary | ICD-10-CM | POA: Diagnosis not present

## 2023-10-10 DIAGNOSIS — E785 Hyperlipidemia, unspecified: Secondary | ICD-10-CM | POA: Diagnosis not present

## 2023-10-10 DIAGNOSIS — E663 Overweight: Secondary | ICD-10-CM | POA: Diagnosis not present

## 2023-10-10 DIAGNOSIS — E119 Type 2 diabetes mellitus without complications: Secondary | ICD-10-CM | POA: Diagnosis not present

## 2023-11-20 DIAGNOSIS — R262 Difficulty in walking, not elsewhere classified: Secondary | ICD-10-CM | POA: Diagnosis not present

## 2023-11-20 DIAGNOSIS — G8191 Hemiplegia, unspecified affecting right dominant side: Secondary | ICD-10-CM | POA: Diagnosis not present

## 2023-11-20 DIAGNOSIS — M25511 Pain in right shoulder: Secondary | ICD-10-CM | POA: Diagnosis not present

## 2023-11-20 DIAGNOSIS — M6281 Muscle weakness (generalized): Secondary | ICD-10-CM | POA: Diagnosis not present

## 2023-11-20 DIAGNOSIS — R278 Other lack of coordination: Secondary | ICD-10-CM | POA: Diagnosis not present

## 2023-11-20 DIAGNOSIS — M25611 Stiffness of right shoulder, not elsewhere classified: Secondary | ICD-10-CM | POA: Diagnosis not present

## 2023-11-21 DIAGNOSIS — I69351 Hemiplegia and hemiparesis following cerebral infarction affecting right dominant side: Secondary | ICD-10-CM | POA: Insufficient documentation

## 2023-11-21 DIAGNOSIS — I672 Cerebral atherosclerosis: Secondary | ICD-10-CM | POA: Diagnosis not present

## 2023-11-21 DIAGNOSIS — I10 Essential (primary) hypertension: Secondary | ICD-10-CM | POA: Diagnosis not present

## 2023-11-21 DIAGNOSIS — E1165 Type 2 diabetes mellitus with hyperglycemia: Secondary | ICD-10-CM | POA: Diagnosis not present

## 2023-11-21 DIAGNOSIS — Z79899 Other long term (current) drug therapy: Secondary | ICD-10-CM | POA: Diagnosis not present

## 2023-11-21 DIAGNOSIS — Z7982 Long term (current) use of aspirin: Secondary | ICD-10-CM | POA: Diagnosis not present

## 2023-12-05 DIAGNOSIS — R278 Other lack of coordination: Secondary | ICD-10-CM | POA: Diagnosis not present

## 2023-12-05 DIAGNOSIS — M6281 Muscle weakness (generalized): Secondary | ICD-10-CM | POA: Diagnosis not present

## 2023-12-05 DIAGNOSIS — I69351 Hemiplegia and hemiparesis following cerebral infarction affecting right dominant side: Secondary | ICD-10-CM | POA: Diagnosis not present

## 2023-12-05 DIAGNOSIS — M25641 Stiffness of right hand, not elsewhere classified: Secondary | ICD-10-CM | POA: Diagnosis not present

## 2023-12-19 DIAGNOSIS — R278 Other lack of coordination: Secondary | ICD-10-CM | POA: Diagnosis not present

## 2023-12-19 DIAGNOSIS — I69351 Hemiplegia and hemiparesis following cerebral infarction affecting right dominant side: Secondary | ICD-10-CM | POA: Diagnosis not present

## 2023-12-19 DIAGNOSIS — M25641 Stiffness of right hand, not elsewhere classified: Secondary | ICD-10-CM | POA: Diagnosis not present

## 2023-12-19 DIAGNOSIS — M6281 Muscle weakness (generalized): Secondary | ICD-10-CM | POA: Diagnosis not present

## 2023-12-23 DIAGNOSIS — M6281 Muscle weakness (generalized): Secondary | ICD-10-CM | POA: Diagnosis not present

## 2023-12-23 DIAGNOSIS — M25641 Stiffness of right hand, not elsewhere classified: Secondary | ICD-10-CM | POA: Diagnosis not present

## 2023-12-23 DIAGNOSIS — I69351 Hemiplegia and hemiparesis following cerebral infarction affecting right dominant side: Secondary | ICD-10-CM | POA: Diagnosis not present

## 2023-12-23 DIAGNOSIS — R278 Other lack of coordination: Secondary | ICD-10-CM | POA: Diagnosis not present

## 2023-12-25 DIAGNOSIS — M6281 Muscle weakness (generalized): Secondary | ICD-10-CM | POA: Diagnosis not present

## 2023-12-25 DIAGNOSIS — I69351 Hemiplegia and hemiparesis following cerebral infarction affecting right dominant side: Secondary | ICD-10-CM | POA: Diagnosis not present

## 2023-12-25 DIAGNOSIS — R278 Other lack of coordination: Secondary | ICD-10-CM | POA: Diagnosis not present

## 2023-12-25 DIAGNOSIS — M25641 Stiffness of right hand, not elsewhere classified: Secondary | ICD-10-CM | POA: Diagnosis not present

## 2023-12-30 DIAGNOSIS — R278 Other lack of coordination: Secondary | ICD-10-CM | POA: Diagnosis not present

## 2023-12-30 DIAGNOSIS — M25511 Pain in right shoulder: Secondary | ICD-10-CM | POA: Diagnosis not present

## 2023-12-30 DIAGNOSIS — M25611 Stiffness of right shoulder, not elsewhere classified: Secondary | ICD-10-CM | POA: Diagnosis not present

## 2023-12-30 DIAGNOSIS — G8191 Hemiplegia, unspecified affecting right dominant side: Secondary | ICD-10-CM | POA: Diagnosis not present

## 2023-12-30 DIAGNOSIS — M6281 Muscle weakness (generalized): Secondary | ICD-10-CM | POA: Diagnosis not present

## 2023-12-30 DIAGNOSIS — R262 Difficulty in walking, not elsewhere classified: Secondary | ICD-10-CM | POA: Diagnosis not present

## 2024-01-01 DIAGNOSIS — G8191 Hemiplegia, unspecified affecting right dominant side: Secondary | ICD-10-CM | POA: Diagnosis not present

## 2024-01-01 DIAGNOSIS — I69351 Hemiplegia and hemiparesis following cerebral infarction affecting right dominant side: Secondary | ICD-10-CM | POA: Diagnosis not present

## 2024-01-01 DIAGNOSIS — R262 Difficulty in walking, not elsewhere classified: Secondary | ICD-10-CM | POA: Diagnosis not present

## 2024-01-01 DIAGNOSIS — M6281 Muscle weakness (generalized): Secondary | ICD-10-CM | POA: Diagnosis not present

## 2024-01-01 DIAGNOSIS — M25511 Pain in right shoulder: Secondary | ICD-10-CM | POA: Diagnosis not present

## 2024-01-01 DIAGNOSIS — R278 Other lack of coordination: Secondary | ICD-10-CM | POA: Diagnosis not present

## 2024-01-01 DIAGNOSIS — M25611 Stiffness of right shoulder, not elsewhere classified: Secondary | ICD-10-CM | POA: Diagnosis not present

## 2024-01-01 DIAGNOSIS — M25641 Stiffness of right hand, not elsewhere classified: Secondary | ICD-10-CM | POA: Diagnosis not present

## 2024-01-04 DIAGNOSIS — R262 Difficulty in walking, not elsewhere classified: Secondary | ICD-10-CM | POA: Diagnosis not present

## 2024-01-04 DIAGNOSIS — M25611 Stiffness of right shoulder, not elsewhere classified: Secondary | ICD-10-CM | POA: Diagnosis not present

## 2024-01-04 DIAGNOSIS — G8191 Hemiplegia, unspecified affecting right dominant side: Secondary | ICD-10-CM | POA: Diagnosis not present

## 2024-01-04 DIAGNOSIS — M6281 Muscle weakness (generalized): Secondary | ICD-10-CM | POA: Diagnosis not present

## 2024-01-04 DIAGNOSIS — R278 Other lack of coordination: Secondary | ICD-10-CM | POA: Diagnosis not present

## 2024-01-04 DIAGNOSIS — M25511 Pain in right shoulder: Secondary | ICD-10-CM | POA: Diagnosis not present

## 2024-01-09 DIAGNOSIS — R278 Other lack of coordination: Secondary | ICD-10-CM | POA: Diagnosis not present

## 2024-01-09 DIAGNOSIS — M6281 Muscle weakness (generalized): Secondary | ICD-10-CM | POA: Diagnosis not present

## 2024-01-09 DIAGNOSIS — M25611 Stiffness of right shoulder, not elsewhere classified: Secondary | ICD-10-CM | POA: Diagnosis not present

## 2024-01-09 DIAGNOSIS — M25511 Pain in right shoulder: Secondary | ICD-10-CM | POA: Diagnosis not present

## 2024-01-09 DIAGNOSIS — R262 Difficulty in walking, not elsewhere classified: Secondary | ICD-10-CM | POA: Diagnosis not present

## 2024-01-09 DIAGNOSIS — G8191 Hemiplegia, unspecified affecting right dominant side: Secondary | ICD-10-CM | POA: Diagnosis not present

## 2024-02-10 DIAGNOSIS — I69351 Hemiplegia and hemiparesis following cerebral infarction affecting right dominant side: Secondary | ICD-10-CM | POA: Diagnosis not present

## 2024-02-10 DIAGNOSIS — R278 Other lack of coordination: Secondary | ICD-10-CM | POA: Diagnosis not present

## 2024-02-10 DIAGNOSIS — M25641 Stiffness of right hand, not elsewhere classified: Secondary | ICD-10-CM | POA: Diagnosis not present

## 2024-02-10 DIAGNOSIS — M6281 Muscle weakness (generalized): Secondary | ICD-10-CM | POA: Diagnosis not present

## 2024-02-19 DIAGNOSIS — E789 Disorder of lipoprotein metabolism, unspecified: Secondary | ICD-10-CM | POA: Diagnosis not present

## 2024-02-19 DIAGNOSIS — I69351 Hemiplegia and hemiparesis following cerebral infarction affecting right dominant side: Secondary | ICD-10-CM | POA: Diagnosis not present

## 2024-02-19 LAB — LIPID PANEL
Cholesterol: 225 — AB (ref 0–200)
HDL: 50 (ref 35–70)
LDL Cholesterol: 155
Triglycerides: 95 (ref 40–160)

## 2024-02-19 LAB — HEMOGLOBIN A1C: Hemoglobin A1C: >14

## 2024-03-03 DIAGNOSIS — Z8673 Personal history of transient ischemic attack (TIA), and cerebral infarction without residual deficits: Secondary | ICD-10-CM | POA: Diagnosis not present

## 2024-03-03 DIAGNOSIS — E1165 Type 2 diabetes mellitus with hyperglycemia: Secondary | ICD-10-CM | POA: Diagnosis not present

## 2024-04-03 DIAGNOSIS — I69051 Hemiplegia and hemiparesis following nontraumatic subarachnoid hemorrhage affecting right dominant side: Secondary | ICD-10-CM | POA: Diagnosis not present

## 2024-04-03 DIAGNOSIS — R262 Difficulty in walking, not elsewhere classified: Secondary | ICD-10-CM | POA: Diagnosis not present

## 2024-04-07 DIAGNOSIS — R262 Difficulty in walking, not elsewhere classified: Secondary | ICD-10-CM | POA: Diagnosis not present

## 2024-04-07 DIAGNOSIS — I69051 Hemiplegia and hemiparesis following nontraumatic subarachnoid hemorrhage affecting right dominant side: Secondary | ICD-10-CM | POA: Diagnosis not present

## 2024-04-09 DIAGNOSIS — I69051 Hemiplegia and hemiparesis following nontraumatic subarachnoid hemorrhage affecting right dominant side: Secondary | ICD-10-CM | POA: Diagnosis not present

## 2024-04-09 DIAGNOSIS — R262 Difficulty in walking, not elsewhere classified: Secondary | ICD-10-CM | POA: Diagnosis not present

## 2024-04-14 DIAGNOSIS — I69051 Hemiplegia and hemiparesis following nontraumatic subarachnoid hemorrhage affecting right dominant side: Secondary | ICD-10-CM | POA: Diagnosis not present

## 2024-04-14 DIAGNOSIS — R262 Difficulty in walking, not elsewhere classified: Secondary | ICD-10-CM | POA: Diagnosis not present

## 2024-04-16 ENCOUNTER — Encounter: Payer: Self-pay | Admitting: Family Medicine

## 2024-04-16 ENCOUNTER — Ambulatory Visit (INDEPENDENT_AMBULATORY_CARE_PROVIDER_SITE_OTHER): Admitting: Family Medicine

## 2024-04-16 VITALS — BP 122/70 | HR 77 | Resp 16 | Ht 75.0 in | Wt 199.0 lb

## 2024-04-16 DIAGNOSIS — Z0000 Encounter for general adult medical examination without abnormal findings: Secondary | ICD-10-CM | POA: Diagnosis not present

## 2024-04-16 DIAGNOSIS — E1165 Type 2 diabetes mellitus with hyperglycemia: Secondary | ICD-10-CM | POA: Diagnosis not present

## 2024-04-16 DIAGNOSIS — I69351 Hemiplegia and hemiparesis following cerebral infarction affecting right dominant side: Secondary | ICD-10-CM

## 2024-04-16 DIAGNOSIS — S46001D Unspecified injury of muscle(s) and tendon(s) of the rotator cuff of right shoulder, subsequent encounter: Secondary | ICD-10-CM | POA: Diagnosis not present

## 2024-04-16 DIAGNOSIS — Z794 Long term (current) use of insulin: Secondary | ICD-10-CM

## 2024-04-16 DIAGNOSIS — E1110 Type 2 diabetes mellitus with ketoacidosis without coma: Secondary | ICD-10-CM

## 2024-04-16 DIAGNOSIS — Z1211 Encounter for screening for malignant neoplasm of colon: Secondary | ICD-10-CM

## 2024-04-16 DIAGNOSIS — S46001A Unspecified injury of muscle(s) and tendon(s) of the rotator cuff of right shoulder, initial encounter: Secondary | ICD-10-CM

## 2024-04-16 NOTE — Progress Notes (Signed)
 Name: Carl Barnett   MRN: [id number]    DOB: November 12, 1965   Date:04/16/2024       Progress Note  Chief Complaint  Patient presents with   Establish Care     Subjective:   Carl Barnett is a 58 y.o. male, presents to establish care, he moved to [place] from [place]  CVA and now onset DM A1c last August prior to that pt had no known med hx and little medical care before that  PA primary care in [place] was managing DM  A1c was >16 to 17 Last A1c with primary care was >14   Lab Results  Component Value Date   HGBA1C >14 02/19/2024     Right shoulder pain, frozen shoulder, past right shoulder surgery 6 months before stroke - [facility] in [place]  He needs PT/OT, neurology and ortho ([place] [facility])  Basal insulin  30 units daily Morning blood sugar pt reports today was 167  He reports he is tolerating amlodipine well for his blood pressure, states he never had any high blood pressure issues even with his stroke, he also was put on atorvastatin 80 mg states he takes this daily and has not had any side effects or concerns    Current Outpatient Medications:    Albuterol  Sulfate 108 (90 Base) MCG/ACT AEPB, Inhale 2 puffs into the lungs every 4 (four) hours as needed (for shortness of breath or wheezing)., Disp: , Rfl:    amLODipine (NORVASC) 5 MG tablet, Take 1 tablet by mouth daily., Disp: , Rfl:    aspirin EC 325 MG tablet, Take 325 mg by mouth once., Disp: , Rfl:    atorvastatin (LIPITOR) 80 MG tablet, Take 1 tablet by mouth at bedtime., Disp: , Rfl:    Insulin  Glargine (BASAGLAR KWIKPEN) 100 UNIT/ML, Inject 30 Units into the skin daily., Disp: , Rfl:    vitamin C  (VITAMIN C ) 500 MG tablet, Take 1 tablet (500 mg total) by mouth daily., Disp: 20 tablet, Rfl: 0  Patient Active Problem List   Diagnosis Date Noted   Hemiparesis affecting right side as late effect of cerebrovascular accident (HCC) 11/21/2023   Acute cerebrovascular accident (CVA) (HCC)  06/11/2023   Diabetic ketoacidosis associated with type 2 diabetes mellitus (HCC) 06/06/2023   Diabetes mellitus (HCC) 11/05/2019   Ulnar neuropathy of left upper extremity 03/12/2018   Spondylosis of cervical region without myelopathy or radiculopathy 01/01/2018    Past Surgical History:  Procedure Laterality Date   SHOULDER ARTHROSCOPY WITH DISTAL CLAVICLE RESECTION Right 11/22/2022   Procedure: SHOULDER ARTHROSCOPY WITH DISTAL CLAVICLE RESECTION;  Surgeon: Gerome Charleston, MD;  Location[facility];  Service: Orthopedics;  Laterality: Right;   SHOULDER ARTHROSCOPY WITH SUBACROMIAL DECOMPRESSION Right 11/22/2022   Procedure: SHOULDER ARTHROSCOPY WITH SUBACROMIAL DECOMPRESSION AND LABRAL DEBRIDEMENT;  Surgeon: Gerome Charleston, MD;  Location: [facility];  Service: Orthopedics;  Laterality: Right;  with interscalene block   WISDOM TOOTH EXTRACTION     early 12s    Family History  Problem Relation Age of Onset   Diabetes Mother    Stroke Mother    Diabetes Father    Hyperlipidemia Father    Hypertension Father     Social History   Tobacco Use   Smoking status: Never   Smokeless tobacco: Never  Vaping Use   Vaping status: Never Used  Substance Use Topics   Alcohol use: No    Alcohol/week: 0.0 standard drinks of alcohol   Drug use: No  No Known Allergies  Health Maintenance  Topic Date Due   FOOT EXAM  Never done   Diabetic kidney evaluation - Urine ACR  Never done   Diabetic kidney evaluation - eGFR measurement  11/23/2023   Colonoscopy  05/19/2024   OPHTHALMOLOGY EXAM  04/16/2024 (Originally 02/16/1974)   COVID-19 Vaccine (1) 05/01/2024 (Originally 02/16/1969)   Zoster Vaccines- Shingrix (1 of 2) 07/16/2024 (Originally 02/17/1983)   Pneumococcal Vaccine 48-12 Years old (1 of 2 - PCV) 04/16/2025 (Originally 02/17/1983)   INFLUENZA VACCINE  05/22/2024   HEMOGLOBIN A1C  08/20/2024   DTaP/Tdap/Td (2 - Td or Tdap) 05/19/2029   Hepatitis C  Screening  Completed   HIV Screening  Completed   Hepatitis B Vaccines  Aged Out   HPV VACCINES  Aged Out   Meningococcal B Vaccine  Aged Out    Chart Review Today: I personally reviewed active problem list, medication list, allergies, family history, social history, health maintenance, notes from last encounter, lab results, imaging with the patient/caregiver today.   Review of Systems  Constitutional: Negative.   HENT: Negative.    Eyes: Negative.   Respiratory: Negative.    Cardiovascular: Negative.   Gastrointestinal: Negative.   Endocrine: Negative.   Genitourinary: Negative.   Musculoskeletal: Negative.   Skin: Negative.   Allergic/Immunologic: Negative.   Neurological: Negative.   Hematological: Negative.   Psychiatric/Behavioral: Negative.    All other systems reviewed and are negative.    Objective:   Vitals:   04/16/24 0909  BP: 122/70  Pulse: 77  Resp: 16  SpO2: 99%  Weight: 199 lb (90.3 kg)  Height: 6' 3 (1.905 m)    Body mass index is 24.87 kg/m.  Physical Exam Vitals and nursing note reviewed.  Constitutional:      General: He is not in acute distress.    Appearance: Normal appearance. He is well-developed. He is not ill-appearing, toxic-appearing or diaphoretic.  HENT:     Head: Normocephalic and atraumatic.     Right Ear: External ear normal.     Left Ear: External ear normal.     Nose: Nose normal.   Eyes:     General: No scleral icterus.       Right eye: No discharge.        Left eye: No discharge.     Conjunctiva/sclera: Conjunctivae normal.   Neck:     Trachea: No tracheal deviation.   Cardiovascular:     Rate and Rhythm: Normal rate and regular rhythm.     Pulses: Normal pulses.          Carotid pulses are 2+ on the right side and 2+ on the left side.      Posterior tibial pulses are 2+ on the right side and 2+ on the left side.     Heart sounds: Normal heart sounds.  Pulmonary:     Effort: Pulmonary effort is normal. No  respiratory distress.     Breath sounds: No stridor.   Musculoskeletal:     Right shoulder: Decreased range of motion.     Right lower leg: No edema.     Left lower leg: No edema.     Comments: Right arm with weight on wrist, limited ROM   Skin:    General: Skin is warm and dry.     Findings: No rash.   Neurological:     Mental Status: He is alert.     Motor: No abnormal muscle tone.     Coordination:  Coordination normal.     Gait: Gait normal.     Comments: Right sided hemiparesis Right LE/foot/ankle in brace  Psychiatric:        Mood and Affect: Mood normal.        Behavior: Behavior normal.      Functional Status Survey: Is the patient deaf or have difficulty hearing?: No Does the patient have difficulty seeing, even when wearing glasses/contacts?: No Does the patient have difficulty concentrating, remembering, or making decisions?: No Does the patient have difficulty walking or climbing stairs?: Yes Does the patient have difficulty dressing or bathing?: No Does the patient have difficulty doing errands alone such as visiting a doctor's office or shopping?: Yes Results for orders placed or performed in visit on 04/15/24  Lipid panel   Collection Time: 02/19/24 12:00 AM  Result Value Ref Range   Triglycerides 95 40 - 160   Cholesterol 225 (A) 0 - 200   HDL 50 35 - 70   LDL Cholesterol 155   Hemoglobin A1c   Collection Time: 02/19/24 12:00 AM  Result Value Ref Range   Hemoglobin A1C >14       Assessment & Plan:     ICD-10-CM   1. Encounter for medical examination to establish care  Z00.00 Comprehensive Metabolic Panel (CMET)    CBC with Differential/Platelet   limited records in chart and care everywhere, req from PCP, reviewed epic/care everywhere as able    2. Hemiparesis affecting right side as late effect of cerebrovascular accident [facility])  I69.351 Ambulatory referral to Neurology    Ambulatory referral to Physical Therapy    AMB Referral [facility] Care  Management   needs to continue working with PT/rehab    3. Colon cancer screening  Z12.11 Ambulatory referral to Gastroenterology    4. Type 2 diabetes mellitus with hyperglycemia, with long-term current use of insulin  (HCC)  E11.65 Comprehensive Metabolic Panel (CMET)   Z79.4 CBC with Differential/Platelet    Urine Microalbumin w/creat. ratio    Hemoglobin A1c    Lipid panel    AMB Referral [facility] Care Management   poorly controlled, on insulin  only, will try to req records, labs today change in management based on lab results - we discussed insulin  titration to 32 units He is on statin and tolerating - will recheck lipids today On norvasc?  Pt reports he never had high BP with stroke - consider change to ACEI/arb with DM for renal protection   Continuous Glucose Sensor (FREESTYLE LIBRE 3 SENSOR) MISC  Continuous Glucose Receiver (FREESTYLE LIBRE 3 READER) DEVI   CGM would be very helpful to pt with limited use of right/dominant arm and for ease of insulin  dose changes  We can help put on his first sensor if he wants, we also have samples available Discussed pharmacist will also be able to help with checking CGM to aid in DM and insulin  management     5. Unspecified injury of muscle(s) and tendon(s) of the rotator cuff of right shoulder, initial encounter  S46.001A Ambulatory referral to Physical Therapy   [facility] did surgery to right shoulder about 6 months prior to stroke aug 2024, decreased ROM, wants PT     ~3 week f/up on DM and CBGs/insulin  adjustments   Michelene Cower, PA-C 04/16/24 9:24 AM

## 2024-04-17 ENCOUNTER — Ambulatory Visit: Payer: Self-pay | Admitting: Family Medicine

## 2024-04-17 ENCOUNTER — Encounter: Payer: Self-pay | Admitting: Family Medicine

## 2024-04-17 DIAGNOSIS — I69351 Hemiplegia and hemiparesis following cerebral infarction affecting right dominant side: Secondary | ICD-10-CM

## 2024-04-17 DIAGNOSIS — E1165 Type 2 diabetes mellitus with hyperglycemia: Secondary | ICD-10-CM

## 2024-04-17 DIAGNOSIS — E785 Hyperlipidemia, unspecified: Secondary | ICD-10-CM

## 2024-04-17 LAB — HEMOGLOBIN A1C
Hgb A1c MFr Bld: 10.3 % — ABNORMAL HIGH (ref <5.7)
Mean Plasma Glucose: 249 mg/dL
eAG (mmol/L): 13.8 mmol/L

## 2024-04-17 LAB — LIPID PANEL
Cholesterol: 183 mg/dL (ref <200)
HDL: 60 mg/dL (ref >=40)
LDL Cholesterol (Calc): 107 mg/dL — ABNORMAL HIGH
Non-HDL Cholesterol (Calc): 123 mg/dL (ref <130)
Total CHOL/HDL Ratio: 3.1 (calc) (ref <5.0)
Triglycerides: 69 mg/dL (ref <150)

## 2024-04-17 LAB — CBC WITH DIFFERENTIAL/PLATELET
Absolute Lymphocytes: 1759 {cells}/uL (ref 850–3900)
Absolute Monocytes: 396 {cells}/uL (ref 200–950)
Basophils Absolute: 22 {cells}/uL (ref 0–200)
Basophils Relative: 0.5 %
Eosinophils Absolute: 90 {cells}/uL (ref 15–500)
Eosinophils Relative: 2.1 %
HCT: 39.4 % (ref 38.5–50.0)
Hemoglobin: 12.5 g/dL — ABNORMAL LOW (ref 13.2–17.1)
MCH: 30.1 pg (ref 27.0–33.0)
MCHC: 31.7 g/dL — ABNORMAL LOW (ref 32.0–36.0)
MCV: 94.9 fL (ref 80.0–100.0)
MPV: 9.8 fL (ref 7.5–12.5)
Monocytes Relative: 9.2 %
Neutro Abs: 2034 {cells}/uL (ref 1500–7800)
Neutrophils Relative %: 47.3 %
Platelets: 240 10*3/uL (ref 140–400)
RBC: 4.15 10*6/uL — ABNORMAL LOW (ref 4.20–5.80)
RDW: 12.7 % (ref 11.0–15.0)
Total Lymphocyte: 40.9 %
WBC: 4.3 10*3/uL (ref 3.8–10.8)

## 2024-04-17 LAB — COMPREHENSIVE METABOLIC PANEL WITH GFR
AG Ratio: 1.4 (calc) (ref 1.0–2.5)
ALT: 23 U/L (ref 9–46)
AST: 18 U/L (ref 10–35)
Albumin: 4.2 g/dL (ref 3.6–5.1)
Alkaline phosphatase (APISO): 62 U/L (ref 35–144)
BUN: 15 mg/dL (ref 7–25)
CO2: 27 mmol/L (ref 20–32)
Calcium: 9.4 mg/dL (ref 8.6–10.3)
Chloride: 100 mmol/L (ref 98–110)
Creat: 0.86 mg/dL (ref 0.70–1.35)
Globulin: 3 g/dL (ref 1.9–3.7)
Glucose, Bld: 173 mg/dL — ABNORMAL HIGH (ref 65–99)
Potassium: 4.1 mmol/L (ref 3.5–5.3)
Sodium: 135 mmol/L (ref 135–146)
Total Bilirubin: 0.5 mg/dL (ref 0.2–1.2)
Total Protein: 7.2 g/dL (ref 6.1–8.1)
eGFR: 99 mL/min/{1.73_m2} (ref >=60)

## 2024-04-17 LAB — MICROALBUMIN / CREATININE URINE RATIO
Creatinine, Urine: 214 mg/dL (ref 20–320)
Microalb Creat Ratio: 6 mg/g{creat} (ref <30)
Microalb, Ur: 1.2 mg/dL

## 2024-04-17 MED ORDER — INSULIN PEN NEEDLE 32G X 4 MM MISC: 1 refills | Status: DC

## 2024-04-17 MED ORDER — ROSUVASTATIN CALCIUM 40 MG PO TABS: 40.0000 mg | ORAL_TABLET | Freq: Every day | ORAL | 1 refills | Status: DC

## 2024-04-17 MED ORDER — BASAGLAR KWIKPEN 100 UNIT/ML ~~LOC~~ SOPN: 20.0000 [IU] | PEN_INJECTOR | Freq: Every day | SUBCUTANEOUS | 2 refills | Status: DC

## 2024-04-17 MED ORDER — FREESTYLE LIBRE 3 SENSOR MISC: 1.0000 | 11 refills | Status: AC

## 2024-04-17 MED ORDER — FREESTYLE LIBRE 3 READER DEVI: 0 refills | Status: DC

## 2024-04-21 ENCOUNTER — Other Ambulatory Visit (HOSPITAL_COMMUNITY): Payer: Self-pay

## 2024-04-21 ENCOUNTER — Telehealth: Payer: Self-pay | Admitting: Pharmacy Technician

## 2024-04-21 DIAGNOSIS — Z794 Long term (current) use of insulin: Secondary | ICD-10-CM

## 2024-04-21 MED ORDER — LANTUS SOLOSTAR 100 UNIT/ML ~~LOC~~ SOPN: 20.0000 [IU] | PEN_INJECTOR | Freq: Every day | SUBCUTANEOUS | 5 refills | Status: DC

## 2024-04-21 NOTE — Telephone Encounter (Signed)
 Pharmacy Patient Advocate Encounter   Received notification from Onbase that prior authorization for [facility] is required/requested.   Insurance verification completed.   The patient is insured through [employer] .   Per test claim:  LANTUS SOLOSTAR PEN is preferred by the insurance.  If suggested medication is appropriate, Please send in a new RX and discontinue this one. If not, please advise as to why it's not appropriate so that we may request a Prior Authorization. Please note, some preferred medications may still require a PA.  If the suggested medications have not been trialed and there are no contraindications to their use, the PA will not be submitted, as it will not be approved.

## 2024-04-21 NOTE — Addendum Note (Signed)
 Addended by: Khaliyah Northrop on: 04/21/2024 02:53 PM   Modules accepted: Orders

## 2024-04-22 ENCOUNTER — Telehealth: Payer: Self-pay

## 2024-04-22 ENCOUNTER — Other Ambulatory Visit (HOSPITAL_COMMUNITY): Payer: Self-pay

## 2024-04-22 NOTE — Progress Notes (Signed)
 Complex Care Management Note Care Guide Note  04/22/2024 Name: Carl Barnett MRN: [id number] DOB: October 22, 1966   Complex Care Management Outreach Attempts: An unsuccessful telephone outreach was attempted today to offer the patient information about available complex care management services.  Follow Up Plan:  Additional outreach attempts will be made to offer the patient complex care management information and services.   Encounter Outcome:  No Answer  Dreama [facility]  [facility], [facility] [place] Health Care Management Assistant Direct Dial: [phone]  Fax: [phone]

## 2024-04-22 NOTE — Telephone Encounter (Signed)
 Awesome, looks like the pharmacy filled Lantus yesterday!

## 2024-05-01 NOTE — Progress Notes (Unsigned)
 Complex Care Management Note Care Guide Note  05/01/2024 Name: Carl Barnett MRN: [id number] DOB: 20-Jul-1966   Complex Care Management Outreach Attempts: A second unsuccessful outreach was attempted today to offer the patient with information about available complex care management services.  Follow Up Plan:  Additional outreach attempts will be made to offer the patient complex care management information and services.   Encounter Outcome:  No Answer  Dreama [facility]  [facility], [facility] Health Care Management Assistant Direct Dial: [phone]  Fax: [phone]

## 2024-05-04 NOTE — Progress Notes (Signed)
 Complex Care Management Note Care Guide Note  05/04/2024 Name: Carl Barnett MRN: [id number] DOB: 10-06-1966   Complex Care Management Outreach Attempts: A third unsuccessful outreach was attempted today to offer the patient with information about available complex care management services.  Follow Up Plan:  No further outreach attempts will be made at this time. We have been unable to contact the patient to offer or enroll patient in complex care management services.  Encounter Outcome:  No Answer  Dreama [facility]  [facility], [facility] Health Care Management Assistant Direct Dial: [phone]  Fax: [phone]

## 2024-05-07 ENCOUNTER — Telehealth: Payer: Self-pay

## 2024-05-07 ENCOUNTER — Telehealth (INDEPENDENT_AMBULATORY_CARE_PROVIDER_SITE_OTHER): Admitting: Family Medicine

## 2024-05-07 ENCOUNTER — Other Ambulatory Visit: Payer: Self-pay | Admitting: Family Medicine

## 2024-05-07 DIAGNOSIS — I10 Essential (primary) hypertension: Secondary | ICD-10-CM

## 2024-05-07 DIAGNOSIS — Z59819 Housing instability, housed unspecified: Secondary | ICD-10-CM

## 2024-05-07 DIAGNOSIS — E785 Hyperlipidemia, unspecified: Secondary | ICD-10-CM | POA: Diagnosis not present

## 2024-05-07 DIAGNOSIS — Z794 Long term (current) use of insulin: Secondary | ICD-10-CM

## 2024-05-07 DIAGNOSIS — I69351 Hemiplegia and hemiparesis following cerebral infarction affecting right dominant side: Secondary | ICD-10-CM

## 2024-05-07 DIAGNOSIS — E1165 Type 2 diabetes mellitus with hyperglycemia: Secondary | ICD-10-CM | POA: Diagnosis not present

## 2024-05-07 MED ORDER — FREESTYLE LIBRE 3 PLUS SENSOR MISC: 4 refills | Status: AC

## 2024-05-07 MED ORDER — INSULIN PEN NEEDLE 32G X 4 MM MISC: 1 refills | Status: AC

## 2024-05-07 MED ORDER — ROSUVASTATIN CALCIUM 40 MG PO TABS: 40.0000 mg | ORAL_TABLET | Freq: Every day | ORAL | 1 refills | Status: AC

## 2024-05-07 MED ORDER — LANTUS SOLOSTAR 100 UNIT/ML ~~LOC~~ SOPN: 20.0000 [IU] | PEN_INJECTOR | Freq: Every day | SUBCUTANEOUS | 5 refills | Status: DC

## 2024-05-07 MED ORDER — AMLODIPINE BESYLATE 5 MG PO TABS
5.0000 mg | ORAL_TABLET | Freq: Every day | ORAL | 1 refills | Status: AC
Start: 2024-05-07 — End: ?

## 2024-05-07 MED ORDER — FREESTYLE LIBRE 3 READER DEVI: 0 refills | Status: AC

## 2024-05-07 NOTE — Telephone Encounter (Signed)
-----   Message from Michelene Cower sent at 05/07/2024 10:24 AM EDT ----- Can we call pt to make his f/up appt in office?  thx

## 2024-05-07 NOTE — Progress Notes (Signed)
 Name: Carl Barnett   MRN: [id number]    DOB: [birth year]   Date:05/07/2024       Progress Note  Subjective:    I connected with  Carl Barnett  on 05/07/24 at  9:40 AM EDT by a video enabled telemedicine application and verified that I am speaking with the correct person using two identifiers.  I discussed the limitations of evaluation and management by telemedicine and the availability of in person appointments. The patient expressed understanding and agreed to proceed. Staff also discussed with the patient that there may be a patient responsible charge related to this service. Patient Location: home in [place] with Carl Barnett Provider Location: [facility] clinic Additional Individuals present: none  Chief Complaint  Patient presents with   Follow-up    Insulin     Carl Barnett is a 58 y.o. male, presents for virtual visit for follow up on uncontolled DM and insulin  titration  A1c 10.3 with 30 units insulin   He has titrated it to 34 units and lowest 130's  Pt est care here about 3 weeks ago, new onset uncontrolled DM on insulin , recent stroke and hospitalization, he unfortunately had to move back to [place] about 45 min from here and he is still woring on securing housing He can come back for future appts in office   He feels good He did not get libre sensors or readers - wants rxs sent to different pharmacy closer to him    Patient Active Problem List   Diagnosis Date Noted   Hemiparesis affecting right side as late effect of cerebrovascular accident (HCC) 11/21/2023   Acute cerebrovascular accident (CVA) (HCC) 06/11/2023   Diabetic ketoacidosis associated with type 2 diabetes mellitus (HCC) 06/06/2023   Diabetes mellitus (HCC) 11/05/2019   Ulnar neuropathy of left upper extremity 03/12/2018   Spondylosis of cervical region without myelopathy or radiculopathy 01/01/2018    Current Outpatient Medications:    Albuterol  Sulfate 108 (90 Base) MCG/ACT AEPB, Inhale 2 puffs into the  lungs every 4 (four) hours as needed (for shortness of breath or wheezing)., Disp: , Rfl:    amLODipine  (NORVASC ) 5 MG tablet, Take 1 tablet by mouth daily., Disp: , Rfl:    aspirin EC 325 MG tablet, Take 325 mg by mouth once., Disp: , Rfl:    Continuous Glucose Receiver (FREESTYLE LIBRE 3 READER) DEVI, Disp one reader to pt to be used with sensor every 14 d for uncontrolled IDDM, Disp: 1 each, Rfl: 0   Continuous Glucose Sensor (FREESTYLE LIBRE 3 SENSOR) MISC, 1 Device by Does not apply route every 14 (fourteen) days., Disp: 2 each, Rfl: 11   insulin  glargine (LANTUS  SOLOSTAR) 100 UNIT/ML Solostar Pen, Inject 20-40 Units into the skin daily., Disp: 15 mL, Rfl: 5   Insulin  Pen Needle 32G X 4 MM MISC, Use as directed with insulin  daily, Disp: 100 each, Rfl: 1   rosuvastatin  (CRESTOR ) 40 MG tablet, Take 1 tablet (40 mg total) by mouth daily., Disp: 90 tablet, Rfl: 1   vitamin C  (VITAMIN C ) 500 MG tablet, Take 1 tablet (500 mg total) by mouth daily., Disp: 20 tablet, Rfl: 0 No Known Allergies  Past Surgical History:  Procedure Laterality Date   SHOULDER ARTHROSCOPY WITH DISTAL CLAVICLE RESECTION Right 11/22/2022   Procedure: SHOULDER ARTHROSCOPY WITH DISTAL CLAVICLE RESECTION;  Surgeon: Gerome Charleston, MD;  Location[facility];  Service: Orthopedics;  Laterality: Right;   SHOULDER ARTHROSCOPY WITH SUBACROMIAL DECOMPRESSION Right 11/22/2022   Procedure: SHOULDER ARTHROSCOPY WITH SUBACROMIAL  DECOMPRESSION AND LABRAL DEBRIDEMENT;  Surgeon: Gerome Charleston, MD;  Location: [facility];  Service: Orthopedics;  Laterality: Right;  with interscalene block   WISDOM TOOTH EXTRACTION     early 79s   Family History  Problem Relation Age of Onset   Kidney disease Mother    Hypertension Mother    Diabetes Mother    Stroke Mother    Heart disease Mother        had a pacemaker   Social History   Socioeconomic History   Marital status: Divorced    Spouse name: Not on file    Number of children: Not on file   Years of education: Not on file   Highest education level: Not on file  Occupational History   Not on file  Tobacco Use   Smoking status: Never   Smokeless tobacco: Never  Vaping Use   Vaping status: Never Used  Substance and Sexual Activity   Alcohol use: No    Alcohol/week: 0.0 standard drinks of alcohol   Drug use: No   Sexual activity: Not Currently    Comment: not in the year  Other Topics Concern   Not on file  Social History Narrative   4 grown kids   One daughter in [place], son in army   Another daughter [place] and another in [place]   Living here with a friend    Social Drivers of Corporate investment banker Strain: Low Risk  (04/16/2024)   Overall Financial Resource Strain (CARDIA)    Difficulty of Paying Living Expenses: Not hard at all  Food Insecurity: No Food Insecurity (04/16/2024)   Hunger Vital Sign    Worried About Running Out of Food in the Last Year: Never true    Ran Out of Food in the Last Year: Never true  Transportation Needs: No Transportation Needs (04/16/2024)   PRAPARE - Administrator, Civil Service (Medical): No    Lack of Transportation (Non-Medical): No  Physical Activity: Sufficiently Active (04/16/2024)   Exercise Vital Sign    Days of Exercise per Week: 7 days    Minutes of Exercise per Session: 60 min  Stress: No Stress Concern Present (04/16/2024)   [employer] of Occupational Health - Occupational Stress Questionnaire    Feeling of Stress: Not at all  Social Connections: Moderately Isolated (04/16/2024)   Social Connection and Isolation Panel    Frequency of Communication with Friends and Family: More than three times a week    Frequency of Social Gatherings with Friends and Family: More than three times a week    Attends Religious Services: More than 4 times per year    Active Member of [employer] or Organizations: No    Attends Banker Meetings: Never    Marital  Status: Divorced  Catering manager Violence: Not At Risk (04/16/2024)   Humiliation, Afraid, Rape, and Kick questionnaire    Fear of Current or Ex-Partner: No    Emotionally Abused: No    Physically Abused: No    Sexually Abused: No    Chart Review Today: I personally reviewed active problem list, medication list, allergies, family history, social history, health maintenance, notes from last encounter, lab results, imaging with the patient/caregiver today.   Review of Systems  Constitutional: Negative.   HENT: Negative.    Eyes: Negative.   Respiratory: Negative.    Cardiovascular: Negative.   Gastrointestinal: Negative.   Endocrine: Negative.   Genitourinary: Negative.   Musculoskeletal:  Negative.   Skin: Negative.   Allergic/Immunologic: Negative.   Neurological: Negative.   Hematological: Negative.   Psychiatric/Behavioral: Negative.    All other systems reviewed and are negative.     Objective:    Virtual encounter, vitals limited, only able to obtain the following There were no vitals filed for this visit. There is no height or weight on file to calculate BMI. Nursing Note and Vital Signs reviewed.  Physical Exam Vitals reviewed.  Constitutional:      Appearance: Normal appearance.  Pulmonary:     Effort: No respiratory distress.  Neurological:     Mental Status: He is alert.  Psychiatric:        Mood and Affect: Mood normal.        Behavior: Behavior normal.     PE limited by Virtual encounter  No results found for this or any previous visit (from the past 72 hours).  PHQ2/9:    04/16/2024    9:18 AM 11/05/2019    8:34 AM 05/20/2019    1:08 PM 05/07/2016    9:13 AM  Depression screen PHQ 2/9  Decreased Interest 0 0 0 0  Down, Depressed, Hopeless 0 0 0 0  PHQ - 2 Score 0 0 0 0   PHQ-2/9 Result is negative and reviewed today  Fall Risk:    04/16/2024    9:18 AM 11/05/2019    8:34 AM 05/20/2019    1:08 PM 05/07/2016    9:13 AM  Fall Risk    Falls in the past year? 0 0  0  No   Number falls in past yr: 0     Injury with Fall? 0     Risk for fall due to : Impaired balance/gait;Impaired mobility     Follow up Falls prevention discussed        Data saved with a previous flowsheet row definition     Assessment and Plan:   Type 2 diabetes mellitus with hyperglycemia, with long-term current use of insulin  (HCC) Assessment & Plan: Recently started on insulin  with A1c >16 Pt est care 3 weeks ago, A1c improved to 10.3 but still uncontrolled Pt encouraged to continue to titrate insulin  by +2 units to fasting blood sugar goal 80 to less than 130's.   Will f/up with clinical pharmacist referral CGM orders to be sent to a new pharmacy closer to him Lab Results  Component Value Date   HGBA1C 10.3 (H) 04/16/2024  He is still taking statin We had planned to check labs and then get him on ACEI/ARB for renal protection, currently on amlodipine     Orders: -     FreeStyle Libre 3 Reader; Disp one reader to pt to be used with sensor every 14 d for uncontrolled IDDM  Dispense: 1 each; Refill: 0 -     FreeStyle Libre 3 Plus Sensor; Change sensor every 15 days.  Dispense: 6 each; Refill: 4 -     Lantus  SoloStar; Inject 20-40 Units into the skin daily.  Dispense: 15 mL; Refill: 5 -     Insulin  Pen Needle; Use as directed with insulin  daily  Dispense: 100 each; Refill: 1 -     Rosuvastatin  Calcium ; Take 1 tablet (40 mg total) by mouth daily.  Dispense: 90 tablet; Refill: 1  Hemiparesis affecting right side as late effect of cerebrovascular accident [facility]) Assessment & Plan: He was referred to local neurologist but unfortunately left the area and is back in [place] On statin and keeping BP  controlled Will need to again try to coordinate f/up care with neurology and rehab services  Orders: -     amLODIPine  Besylate; Take 1 tablet (5 mg total) by mouth daily.  Dispense: 90 tablet; Refill: 1 -     Rosuvastatin  Calcium ; Take 1 tablet (40  mg total) by mouth daily.  Dispense: 90 tablet; Refill: 1  Housing instability - moved to [place] then est care with us , then had to move back to [facility] he is having difficulty getting housing with recent stroke/disability   Hyperlipidemia, unspecified hyperlipidemia type Lab Results  Component Value Date   CHOL 183 04/16/2024   HDL 60 04/16/2024   LDLCALC 107 (H) 04/16/2024   TRIG 69 04/16/2024   CHOLHDL 3.1 04/16/2024  LDL goal <55 will recheck when he does f/up OV -     Rosuvastatin  Calcium ; Take 1 tablet (40 mg total) by mouth daily.  Dispense: 90 tablet; Refill: 1  Hypertension, unspecified type BP Readings from Last 3 Encounters:  04/16/24 122/70  11/22/22 (!) 142/91  03/15/22 (!) 124/95  Continue amlodipine  -     amLODIPine  Besylate; Take 1 tablet (5 mg total) by mouth daily.  Dispense: 90 tablet; Refill: 1     I discussed the assessment and treatment plan with the patient. The patient was provided an opportunity to ask questions and all were answered. The patient agreed with the plan and demonstrated an understanding of the instructions.  The patient was advised to call back or seek an in-person evaluation if the symptoms worsen or if the condition fails to improve as anticipated.  I provided 15+ minutes of non-face-to-face time during this encounter.   Return in about 2 months (around 07/22/2024) for IDDM follow in office labs.   Michelene Cower, PA-C 05/07/24 10:02 AM

## 2024-05-07 NOTE — Assessment & Plan Note (Addendum)
 Recently started on insulin  with A1c >16 Pt est care 3 weeks ago, A1c improved to 10.3 but still uncontrolled Pt encouraged to continue to titrate insulin  by +2 units to fasting blood sugar goal 80 to less than 130's.   Will f/up with clinical pharmacist referral CGM orders to be sent to a new pharmacy closer to him Lab Results  Component Value Date   HGBA1C 10.3 (H) 04/16/2024  He is still taking statin We had planned to check labs and then get him on ACEI/ARB for renal protection, currently on amlodipine 

## 2024-05-07 NOTE — Assessment & Plan Note (Signed)
 He was referred to local neurologist but unfortunately left the area and is back in [place] On statin and keeping BP controlled Will need to again try to coordinate f/up care with neurology and rehab services

## 2024-05-07 NOTE — Telephone Encounter (Signed)
 Lvm for pt to call back and schedule in 2 mths with Michelene Cower

## 2024-05-08 NOTE — Telephone Encounter (Signed)
 Requested medications are due for refill today.  See note  Requested medications are on the active medications list.  no  Last refill. yesterday  Future visit scheduled.   no  Notes to clinic.  Pharmacy comment: Alternative Requested:NOT COVERED CONSIDER ALTERNATIVE.    Requested Prescriptions  Pending Prescriptions Disp Refills   TRESIBA FLEXTOUCH 100 UNIT/ML FlexTouch Pen [Pharmacy Med Name: TRESIBA FLEXTOUCH 100 UNIT/ML]  0     Endocrinology:  Diabetes - Insulins Failed - 05/08/2024  1:32 PM      Failed - HBA1C is between 0 and 7.9 and within 180 days    Hemoglobin A1C  Date Value Ref Range Status  02/19/2024 >14  Final   Hgb A1c MFr Bld  Date Value Ref Range Status  04/16/2024 10.3 (H) <5.7 % Final    Comment:    For someone without known diabetes, a hemoglobin A1c value of 6.5% or greater indicates that they may have  diabetes and this should be confirmed with a follow-up  test. . For someone with known diabetes, a value <7% indicates  that their diabetes is well controlled and a value  greater than or equal to 7% indicates suboptimal  control. A1c targets should be individualized based on  duration of diabetes, age, comorbid conditions, and  other considerations. . Currently, no consensus exists regarding use of hemoglobin A1c for diagnosis of diabetes for children. SABRA Amy - Valid encounter within last 6 months    Recent Outpatient Visits           Yesterday Type 2 diabetes mellitus with hyperglycemia, with long-term current use of insulin  [facility])   [facility] Leavy Mole, PA-C   3 weeks ago Encounter for medical examination to establish care   [facility] Leavy Mole, PA-C   8 years ago Erectile dysfunction, unspecified erectile dysfunction type   [facility] at [facility], Alm DEL, MD   8 years ago Annual physical exam   [facility], Alm DEL, MD

## 2024-05-12 ENCOUNTER — Other Ambulatory Visit (HOSPITAL_COMMUNITY): Payer: Self-pay

## 2024-05-12 ENCOUNTER — Other Ambulatory Visit: Payer: Self-pay | Admitting: Family Medicine

## 2024-05-12 DIAGNOSIS — E1165 Type 2 diabetes mellitus with hyperglycemia: Secondary | ICD-10-CM

## 2024-05-12 MED ORDER — TRESIBA FLEXTOUCH 100 UNIT/ML ~~LOC~~ SOPN
20.0000 [IU] | PEN_INJECTOR | Freq: Every day | SUBCUTANEOUS | 0 refills | Status: AC
Start: 2024-05-12 — End: ?

## 2024-05-12 NOTE — Telephone Encounter (Signed)
 Pt on preferred Lantus 

## 2024-05-13 ENCOUNTER — Telehealth: Payer: Self-pay | Admitting: Pharmacy Technician

## 2024-05-13 ENCOUNTER — Other Ambulatory Visit (HOSPITAL_COMMUNITY): Payer: Self-pay

## 2024-05-13 NOTE — Telephone Encounter (Signed)
 Pharmacy Patient Advocate Encounter   Received notification from Onbase that prior authorization for Tresiba  FlexTouch (insulin  degludec injection) 100 Units/mL solution is required/requested.   Insurance verification completed.   The patient is insured through [facility] .   Per test claim: PA required; PA submitted to above mentioned insurance via CoverMyMeds Key/confirmation #/[id number] [place] Status is pending

## 2024-05-13 NOTE — Telephone Encounter (Signed)
 Pharmacy Patient Advocate Encounter  Received notification from [facility] that Prior Authorization for Tresiba  FlexTouch (insulin  degludec injection) 100 Units/mL solution has been APPROVED from 05/13/24 to 05/14/27. Ran test claim, Copay is $4.00. This test claim was processed through [facility]- copay amounts may vary at other pharmacies due to pharmacy/plan contracts, or as the patient moves through the different stages of their insurance plan.   PA #/Case ID/Reference #: [facility]

## 2024-05-30 DIAGNOSIS — I639 Cerebral infarction, unspecified: Secondary | ICD-10-CM | POA: Diagnosis not present

## 2024-06-08 ENCOUNTER — Telehealth: Payer: Self-pay

## 2024-06-08 NOTE — Telephone Encounter (Signed)
 Brief Telephone Documentation Reason for Call: Medication Management  Summary of Call: Attempted to call patient to schedule appointment with clinical pharmacist for DM management. No answer, left voicemail with callback number.   Jaxsyn Azam E. Marsh, PharmD Clinical Pharmacist [facility] [phone]

## 2024-06-12 NOTE — Telephone Encounter (Signed)
   06/12/2024  Carl Barnett  DOB: 05/11/1966 MRN: [id number]  Attempted to contact patient for medication management/review. Left HIPAA compliant message for patient to return my call at their convenience.   Second attempt for patient outreach. Will follow up with patient in 3-5 business days.  Shonette Rhames E. Marsh, PharmD Clinical Pharmacist [facility] [phone]

## 2024-06-16 NOTE — Telephone Encounter (Signed)
   06/16/2024  Carl Barnett  DOB: [birth year] MRN: [id number]  Attempted to contact patient for medication management/review. Left HIPAA compliant message for patient to return my call at their convenience.   Third attempt for patient outreach.   Lalita Ebel E. Marsh, PharmD Clinical Pharmacist [facility] [phone]

## 2024-08-20 ENCOUNTER — Telehealth: Payer: Self-pay

## 2024-08-20 NOTE — Telephone Encounter (Signed)
 Patient was identified as falling into the [place] Measure - Diabetes.   Patient was: Left voicemail to schedule with primary care provider.
